# Patient Record
Sex: Male | Born: 1965 | Race: White | Hispanic: No | Marital: Married | Smoking: Current every day smoker
Health system: Southern US, Community
[De-identification: ages and names within clinical notes are randomized; demographics above are authoritative.]

## PROBLEM LIST (undated history)

## (undated) DIAGNOSIS — N200 Calculus of kidney: Secondary | ICD-10-CM

## (undated) DIAGNOSIS — E7800 Pure hypercholesterolemia, unspecified: Secondary | ICD-10-CM

## (undated) DIAGNOSIS — C4491 Basal cell carcinoma of skin, unspecified: Secondary | ICD-10-CM

## (undated) DIAGNOSIS — M4800 Spinal stenosis, site unspecified: Secondary | ICD-10-CM

## (undated) DIAGNOSIS — I10 Essential (primary) hypertension: Secondary | ICD-10-CM

## (undated) DIAGNOSIS — K219 Gastro-esophageal reflux disease without esophagitis: Secondary | ICD-10-CM

## (undated) DIAGNOSIS — Z87442 Personal history of urinary calculi: Secondary | ICD-10-CM

## (undated) HISTORY — PX: BACK SURGERY: SHX140

## (undated) HISTORY — PX: MOHS SURGERY: SUR867

---

## 2005-05-22 ENCOUNTER — Emergency Department (HOSPITAL_COMMUNITY): Admission: EM | Admit: 2005-05-22 | Discharge: 2005-05-22 | Payer: Self-pay | Admitting: Emergency Medicine

## 2009-10-12 ENCOUNTER — Ambulatory Visit (HOSPITAL_COMMUNITY): Admission: RE | Admit: 2009-10-12 | Discharge: 2009-10-12 | Payer: Self-pay | Admitting: Urology

## 2010-03-04 ENCOUNTER — Emergency Department (HOSPITAL_COMMUNITY): Admission: EM | Admit: 2010-03-04 | Discharge: 2010-03-05 | Payer: Self-pay | Admitting: Emergency Medicine

## 2010-12-18 ENCOUNTER — Emergency Department (HOSPITAL_COMMUNITY)
Admission: EM | Admit: 2010-12-18 | Discharge: 2010-12-18 | Payer: Self-pay | Source: Home / Self Care | Admitting: Emergency Medicine

## 2010-12-21 LAB — POCT I-STAT, CHEM 8
BUN: 16 mg/dL (ref 6–23)
Chloride: 109 mEq/L (ref 96–112)
Glucose, Bld: 107 mg/dL — ABNORMAL HIGH (ref 70–99)
Sodium: 140 mEq/L (ref 135–145)
TCO2: 23 mmol/L (ref 0–100)

## 2010-12-21 LAB — URINALYSIS, ROUTINE W REFLEX MICROSCOPIC
Bilirubin Urine: NEGATIVE
Ketones, ur: NEGATIVE mg/dL
Nitrite: NEGATIVE
Protein, ur: NEGATIVE mg/dL
Urobilinogen, UA: 0.2 mg/dL (ref 0.0–1.0)

## 2010-12-21 LAB — URINE MICROSCOPIC-ADD ON

## 2011-01-07 ENCOUNTER — Ambulatory Visit (HOSPITAL_BASED_OUTPATIENT_CLINIC_OR_DEPARTMENT_OTHER)
Admission: RE | Admit: 2011-01-07 | Discharge: 2011-01-07 | Disposition: A | Discharge status: Home / Self Care | Payer: 59 | Source: Ambulatory Visit | Attending: Urology | Admitting: Urology

## 2011-01-07 ENCOUNTER — Emergency Department (HOSPITAL_COMMUNITY)
Admission: EM | Admit: 2011-01-07 | Discharge: 2011-01-07 | Disposition: A | Discharge status: Home / Self Care | Payer: 59 | Source: Home / Self Care | Attending: Emergency Medicine | Admitting: Emergency Medicine

## 2011-01-07 DIAGNOSIS — N200 Calculus of kidney: Secondary | ICD-10-CM | POA: Insufficient documentation

## 2011-01-07 DIAGNOSIS — R109 Unspecified abdominal pain: Secondary | ICD-10-CM | POA: Insufficient documentation

## 2011-01-07 DIAGNOSIS — N201 Calculus of ureter: Secondary | ICD-10-CM | POA: Insufficient documentation

## 2011-01-07 LAB — POCT I-STAT, CHEM 8
Creatinine, Ser: 1.2 mg/dL (ref 0.4–1.5)
Glucose, Bld: 111 mg/dL — ABNORMAL HIGH (ref 70–99)
HCT: 49 % (ref 39.0–52.0)
Hemoglobin: 16.7 g/dL (ref 13.0–17.0)
Potassium: 3.9 mEq/L (ref 3.5–5.1)

## 2011-01-07 LAB — URINALYSIS, ROUTINE W REFLEX MICROSCOPIC
Ketones, ur: NEGATIVE mg/dL
Nitrite: NEGATIVE
Protein, ur: 30 mg/dL — AB
Specific Gravity, Urine: 1.025 (ref 1.005–1.030)
pH: 5.5 (ref 5.0–8.0)

## 2011-01-07 LAB — URINE MICROSCOPIC-ADD ON

## 2011-01-08 LAB — URINE CULTURE
Colony Count: NO GROWTH
Culture: NO GROWTH

## 2011-01-10 NOTE — Op Note (Signed)
  Tristan Wood, Tristan Wood              ACCOUNT NO.:  [id number]  MEDICAL RECORD NO.:  [id number]           PATIENT TYPE:  E  LOCATION:  [facility]                         FACILITY:  [facility]  PHYSICIAN:  Maretta Bees. Vonita Moss, M.D.DATE OF BIRTH:  December 07, 1965  DATE OF PROCEDURE:  01/07/2011 DATE OF DISCHARGE:  01/07/2011                              OPERATIVE REPORT   PREOPERATIVE DIAGNOSIS:  Distal right ureteral stone.  POSTOPERATIVE DIAGNOSIS:  Distal right ureteral stone.  PROCEDURE:  Cystoscopy, right ureteroscopy, laser fragmentation and basketing of stone.  SURGEON:  Maretta Bees. Vonita Moss, M.D.  ANESTHESIA:  General.  INDICATIONS:  This gentleman has had intermittent pain due to a 5 to 6 mm distal right ureteral stone.  He had severe pain in the last 24 hours and saw Dr. Aldean Ast in the office and scheduled him for me to proceed with removal of the stone.  PROCEDURE IN DETAIL:  The patient was brought to the operating room and placed in the lithotomy position.  The external genitalia were prepped and draped in the usual fashion.  He was cystoscoped.  The anterior urethra was normal.  The prostate had partial obstruction.  The bladder was unremarkable except for some bloody urine discharging from the right ureteral orifice.  A retractable core guidewire was placed easily up the ureter and I thought that I saw the stone migrate more proximal.  I then inserted the 6-French rigid ureteroscope and found the stone in the lower ureter.  It looked too large to basket but was very irregular and it appeared that it would easily fragment.  Therefore, I inserted the Holmium laser probe and broke the stone into multiple small pieces.  I then used a nitinol stone basket to pull these stones into the bladder and dropped them in the bladder.  I then ureteroscoped him one last time and there was no evidence of residual stones and there was no evidence of ureteral mucosal injury or undue trauma.  I  therefore left the double- J catheter out, which the patient had hoped would be the case.  I cystoscoped him and washed out a couple of tiny stone fragments.  The bladder was emptied and the scope was removed.  The patient was sent to the recovery room in good condition.     Maretta Bees. Vonita Moss, M.D.     LJP/MEDQ  D:  01/07/2011  T:  01/07/2011  Job:  [id number]  Electronically Signed by Larey Dresser M.D. on 01/10/2011 01:21:59 PM

## 2011-02-16 LAB — URINALYSIS, ROUTINE W REFLEX MICROSCOPIC
Ketones, ur: NEGATIVE mg/dL
Nitrite: NEGATIVE
Urobilinogen, UA: 1 mg/dL (ref 0.0–1.0)
pH: 5.5 (ref 5.0–8.0)

## 2011-02-16 LAB — URINE MICROSCOPIC-ADD ON

## 2012-01-01 ENCOUNTER — Emergency Department (HOSPITAL_COMMUNITY): Payer: 59

## 2012-01-01 ENCOUNTER — Encounter (HOSPITAL_COMMUNITY): Payer: Self-pay | Admitting: *Deleted

## 2012-01-01 ENCOUNTER — Emergency Department (HOSPITAL_COMMUNITY)
Admission: EM | Admit: 2012-01-01 | Discharge: 2012-01-01 | Disposition: A | Discharge status: Home / Self Care | Payer: 59 | Attending: Emergency Medicine | Admitting: Emergency Medicine

## 2012-01-01 DIAGNOSIS — F17200 Nicotine dependence, unspecified, uncomplicated: Secondary | ICD-10-CM | POA: Insufficient documentation

## 2012-01-01 DIAGNOSIS — R109 Unspecified abdominal pain: Secondary | ICD-10-CM | POA: Insufficient documentation

## 2012-01-01 DIAGNOSIS — R300 Dysuria: Secondary | ICD-10-CM | POA: Insufficient documentation

## 2012-01-01 DIAGNOSIS — R110 Nausea: Secondary | ICD-10-CM | POA: Insufficient documentation

## 2012-01-01 DIAGNOSIS — N200 Calculus of kidney: Secondary | ICD-10-CM | POA: Insufficient documentation

## 2012-01-01 HISTORY — DX: Calculus of kidney: N20.0

## 2012-01-01 HISTORY — DX: Basal cell carcinoma of skin, unspecified: C44.91

## 2012-01-01 LAB — URINALYSIS, ROUTINE W REFLEX MICROSCOPIC
Bilirubin Urine: NEGATIVE
Glucose, UA: NEGATIVE mg/dL
Leukocytes, UA: NEGATIVE
Nitrite: NEGATIVE
pH: 7 (ref 5.0–8.0)

## 2012-01-01 LAB — COMPREHENSIVE METABOLIC PANEL
Albumin: 3.9 g/dL (ref 3.5–5.2)
CO2: 23 mEq/L (ref 19–32)
Chloride: 105 mEq/L (ref 96–112)
GFR calc non Af Amer: >90 mL/min (ref >90)
Sodium: 138 mEq/L (ref 135–145)
Total Bilirubin: 0.3 mg/dL (ref 0.3–1.2)
Total Protein: 7 g/dL (ref 6.0–8.3)

## 2012-01-01 LAB — URINE MICROSCOPIC-ADD ON

## 2012-01-01 MED ORDER — TAMSULOSIN HCL 0.4 MG PO CAPS: 0.4000 mg | ORAL_CAPSULE | Freq: Every day | ORAL | Status: DC

## 2012-01-01 MED ORDER — HYDROMORPHONE HCL PF 1 MG/ML IJ SOLN
1.0000 mg | Freq: Once | INTRAMUSCULAR | Status: AC
  Administered 2012-01-01: 1 mg via INTRAVENOUS
  Filled 2012-01-01: qty 1

## 2012-01-01 MED ORDER — HYDROCODONE-ACETAMINOPHEN 5-325 MG PO TABS: 1.0000 | ORAL_TABLET | Freq: Three times a day (TID) | ORAL | Status: AC | PRN

## 2012-01-01 MED ORDER — KETOROLAC TROMETHAMINE 30 MG/ML IJ SOLN
30.0000 mg | Freq: Once | INTRAMUSCULAR | Status: AC
  Administered 2012-01-01: 30 mg via INTRAVENOUS
  Filled 2012-01-01: qty 1

## 2012-01-01 MED ORDER — SODIUM CHLORIDE 0.9 % IV BOLUS (SEPSIS)
1000.0000 mL | Freq: Once | INTRAVENOUS | Status: AC
  Administered 2012-01-01: 1000 mL via INTRAVENOUS

## 2012-01-01 MED ORDER — OXYCODONE-ACETAMINOPHEN 5-325 MG PO TABS: 1.0000 | ORAL_TABLET | Freq: Three times a day (TID) | ORAL | Status: AC | PRN

## 2012-01-01 NOTE — ED Provider Notes (Signed)
History     CSN: [id number]  Arrival date & time 01/01/12  1018   First MD Initiated Contact with Patient 01/01/12 1043      Chief Complaint  Patient presents with  . Flank Pain    Right  . Dysuria    HPI The patient p/w pain for two days.  The onset was gradual.  Since onset there has been crampy, sharp pain focally in the R flank.  The pain radiates minimally towards the R inguinal crease.  +mild dysuria and nausea.  No f/c, no emesis or diarrhea.  No relief w otc meds.  The patient has "8" prior kidney stones.  Some have required lithotripsy. Past Medical History  Diagnosis Date  . Kidney stones   . Cancer   . Basal cell carcinoma     Past Surgical History  Procedure Date  . Mohs surgery     History reviewed. No pertinent family history.  History  Substance Use Topics  . Smoking status: Current Everyday Smoker -- 2.0 packs/day    Types: Cigarettes  . Smokeless tobacco: Never Used  . Alcohol Use: Yes     occ      Review of Systems  Constitutional:       Per HPI, otherwise negative  HENT:       Per HPI, otherwise negative  Eyes: Negative.   Respiratory:       Per HPI, otherwise negative  Cardiovascular:       Per HPI, otherwise negative  Gastrointestinal: Negative for vomiting.  Genitourinary: Negative.   Musculoskeletal:       Per HPI, otherwise negative  Skin: Negative.   Neurological: Negative for syncope.    Allergies  Review of patient's allergies indicates no known allergies.  Home Medications   Current Outpatient Rx  Name Route Sig Dispense Refill  . ZOLPIDEM TARTRATE ER 12.5 MG PO TBCR Oral Take 12.5 mg by mouth every evening.      BP 124/93  Pulse 64  Temp(Src) 98 F (36.7 C) (Oral)  Resp 16  Wt 220 lb (99.791 kg)  SpO2 99%  Physical Exam  Nursing note and vitals reviewed. Constitutional: He is oriented to person, place, and time. He appears well-developed. No distress.  HENT:  Head: Normocephalic and atraumatic.  Eyes:  Conjunctivae and EOM are normal.  Cardiovascular: Normal rate and regular rhythm.   Pulmonary/Chest: Effort normal. No stridor. No respiratory distress.  Abdominal: He exhibits no distension. There is no tenderness. There is CVA tenderness. There is no rigidity and no guarding.  Musculoskeletal: He exhibits no edema.  Neurological: He is alert and oriented to person, place, and time.  Skin: Skin is warm and dry.  Psychiatric: He has a normal mood and affect.    ED Course  Procedures (including critical care time)  Labs Reviewed  URINALYSIS, ROUTINE W REFLEX MICROSCOPIC - Abnormal; Notable for the following:    APPearance TURBID (*)    Hgb urine dipstick LARGE (*)    All other components within normal limits  COMPREHENSIVE METABOLIC PANEL - Abnormal; Notable for the following:    Glucose, Bld 101 (*)    All other components within normal limits  URINE MICROSCOPIC-ADD ON   No results found.   No diagnosis found.  X-ray reviewed by me  MDM  This 46 year old male with multiple prior kidney stones presents with acute onset right flank pain.  100 he is in no distress.  Patient's urinalysis is consistent with kidney stone, and  his labs do not demonstrate worsening renal function.  The patient had an x-ray which did not demonstrate stones, though the patient was study are noted.  Given the patient's significant prior radiation exposure, he and I discussed the utility of additional imaging versus follow up with urology.  The patient defers CAT scan today, and will followup as directed.  He and his wife were provided explicit return precautions.  He was discharged in stable condition.        Gerhard Munch, MD 01/01/12 1254

## 2012-01-01 NOTE — ED Notes (Signed)
Pt reports right sides flank and back pain with radiation to his right groin. Pt reports he began to have pain on Friday that was less severe and intermittent. Pt reports pain has become worse this AM and is now constant. Pt denies taking anything for pain. Pt reports nausea without vomiting. Pt denies fever, painful urination, increased frequency, fevers, or diarrhea. Pt says he has a history of kidney stones and this pain feels similar.

## 2012-01-01 NOTE — ED Notes (Signed)
Pt from home c/o right flank pain, nausea and dysuria since Friday, hx of kidney stones.

## 2012-07-04 ENCOUNTER — Emergency Department (HOSPITAL_BASED_OUTPATIENT_CLINIC_OR_DEPARTMENT_OTHER): Payer: 59

## 2012-07-04 ENCOUNTER — Emergency Department (HOSPITAL_BASED_OUTPATIENT_CLINIC_OR_DEPARTMENT_OTHER)
Admission: EM | Admit: 2012-07-04 | Discharge: 2012-07-04 | Disposition: A | Discharge status: Home / Self Care | Payer: 59 | Attending: Emergency Medicine | Admitting: Emergency Medicine

## 2012-07-04 ENCOUNTER — Encounter (HOSPITAL_BASED_OUTPATIENT_CLINIC_OR_DEPARTMENT_OTHER): Payer: Self-pay | Admitting: *Deleted

## 2012-07-04 DIAGNOSIS — R109 Unspecified abdominal pain: Secondary | ICD-10-CM | POA: Insufficient documentation

## 2012-07-04 DIAGNOSIS — N201 Calculus of ureter: Secondary | ICD-10-CM | POA: Insufficient documentation

## 2012-07-04 DIAGNOSIS — N200 Calculus of kidney: Secondary | ICD-10-CM

## 2012-07-04 DIAGNOSIS — D72829 Elevated white blood cell count, unspecified: Secondary | ICD-10-CM

## 2012-07-04 LAB — BASIC METABOLIC PANEL
BUN: 16 mg/dL (ref 6–23)
Calcium: 9.5 mg/dL (ref 8.4–10.5)
Creatinine, Ser: 0.8 mg/dL (ref 0.50–1.35)
GFR calc Af Amer: >90 mL/min (ref >90)
GFR calc non Af Amer: >90 mL/min (ref >90)
Glucose, Bld: 112 mg/dL — ABNORMAL HIGH (ref 70–99)

## 2012-07-04 LAB — CBC WITH DIFFERENTIAL/PLATELET
Eosinophils Absolute: 0 10*3/uL (ref 0.0–0.7)
Hemoglobin: 16.5 g/dL (ref 13.0–17.0)
Lymphocytes Relative: 14 % (ref 12–46)
MCHC: 36.6 g/dL — ABNORMAL HIGH (ref 30.0–36.0)
Neutrophils Relative %: 77 % (ref 43–77)
Platelets: 383 10*3/uL (ref 150–400)
RBC: 5.31 MIL/uL (ref 4.22–5.81)
RDW: 13.4 % (ref 11.5–15.5)
WBC: 22.8 10*3/uL — ABNORMAL HIGH (ref 4.0–10.5)

## 2012-07-04 LAB — URINE MICROSCOPIC-ADD ON

## 2012-07-04 LAB — URINALYSIS, ROUTINE W REFLEX MICROSCOPIC
Leukocytes, UA: NEGATIVE
Nitrite: NEGATIVE
Specific Gravity, Urine: 1.023 (ref 1.005–1.030)
Urobilinogen, UA: 0.2 mg/dL (ref 0.0–1.0)

## 2012-07-04 MED ORDER — SULFAMETHOXAZOLE-TMP DS 800-160 MG PO TABS
1.0000 | ORAL_TABLET | Freq: Once | ORAL | Status: AC
  Administered 2012-07-04: 1 via ORAL
  Filled 2012-07-04: qty 1

## 2012-07-04 MED ORDER — FENTANYL CITRATE 0.05 MG/ML IJ SOLN
50.0000 ug | Freq: Once | INTRAMUSCULAR | Status: AC
  Administered 2012-07-04: 50 ug via INTRAVENOUS
  Filled 2012-07-04: qty 2

## 2012-07-04 MED ORDER — OXYCODONE-ACETAMINOPHEN 5-325 MG PO TABS
2.0000 | ORAL_TABLET | Freq: Once | ORAL | Status: DC
  Filled 2012-07-04: qty 2

## 2012-07-04 MED ORDER — TAMSULOSIN HCL 0.4 MG PO CAPS: 0.4000 mg | ORAL_CAPSULE | Freq: Every day | ORAL | Status: DC

## 2012-07-04 MED ORDER — OXYCODONE-ACETAMINOPHEN 5-325 MG PO TABS: 1.0000 | ORAL_TABLET | Freq: Four times a day (QID) | ORAL | Status: AC | PRN

## 2012-07-04 MED ORDER — ONDANSETRON 8 MG PO TBDP: ORAL_TABLET | ORAL | Status: AC

## 2012-07-04 MED ORDER — SULFAMETHOXAZOLE-TRIMETHOPRIM 800-160 MG PO TABS: 1.0000 | ORAL_TABLET | Freq: Two times a day (BID) | ORAL | Status: AC

## 2012-07-04 MED ORDER — IBUPROFEN 600 MG PO TABS: 600.0000 mg | ORAL_TABLET | Freq: Four times a day (QID) | ORAL | Status: AC | PRN

## 2012-07-04 MED ORDER — ONDANSETRON HCL 4 MG/2ML IJ SOLN
4.0000 mg | Freq: Once | INTRAMUSCULAR | Status: AC
  Administered 2012-07-04: 4 mg via INTRAVENOUS
  Filled 2012-07-04: qty 2

## 2012-07-04 MED ORDER — KETOROLAC TROMETHAMINE 30 MG/ML IJ SOLN
30.0000 mg | Freq: Once | INTRAMUSCULAR | Status: AC
  Administered 2012-07-04: 30 mg via INTRAVENOUS
  Filled 2012-07-04: qty 1

## 2012-07-04 MED ORDER — HYDROMORPHONE HCL PF 1 MG/ML IJ SOLN
1.0000 mg | Freq: Once | INTRAMUSCULAR | Status: AC
  Administered 2012-07-04: 1 mg via INTRAVENOUS
  Filled 2012-07-04: qty 1

## 2012-07-04 MED ORDER — SODIUM CHLORIDE 0.9 % IV BOLUS (SEPSIS)
1000.0000 mL | Freq: Once | INTRAVENOUS | Status: AC
  Administered 2012-07-04: 1000 mL via INTRAVENOUS

## 2012-07-04 NOTE — ED Notes (Signed)
Returned from CT scan.

## 2012-07-04 NOTE — ED Notes (Addendum)
C/o left flank and back pain that start about 4 hours ago. States hx of kidney stones and similar pain. States he feels like he needs to urinate,but not able to. C/o nausea denies fever. Rates pain 10/10  Also has a hx of back pain and took a vicodin around 930pm  Last night.

## 2012-07-04 NOTE — ED Provider Notes (Signed)
History     CSN: [id number]  Arrival date & time 07/04/12  0258   First MD Initiated Contact with Patient 07/04/12 0309      Chief Complaint  Patient presents with  . Flank Pain    (Consider location/radiation/quality/duration/timing/severity/associated sxs/prior treatment) Patient is a 45 y.o. male presenting with flank pain. The history is provided by the patient. No language interpreter was used.  Flank Pain This is a recurrent problem. The current episode started 6 to 12 hours ago. The problem occurs constantly. The problem has not changed since onset.Pertinent negatives include no chest pain, no abdominal pain, no headaches and no shortness of breath. Nothing aggravates the symptoms. Nothing relieves the symptoms. Treatments tried: narcotic pain medication. The treatment provided no relief.    Past Medical History  Diagnosis Date  . Kidney stones   . Cancer   . Basal cell carcinoma   . Kidney stone     Past Surgical History  Procedure Date  . Mohs surgery     No family history on file.  History  Substance Use Topics  . Smoking status: Current Everyday Smoker -- 2.0 packs/day    Types: Cigarettes  . Smokeless tobacco: Never Used  . Alcohol Use: Yes     occ      Review of Systems  Constitutional: Negative for fever.  Respiratory: Negative for shortness of breath.   Cardiovascular: Negative for chest pain.  Gastrointestinal: Negative for nausea, vomiting and abdominal pain.  Genitourinary: Positive for flank pain. Negative for dysuria and hematuria.  Neurological: Negative for headaches.  All other systems reviewed and are negative.    Allergies  Review of patient's allergies indicates no known allergies.  Home Medications   Current Outpatient Rx  Name Route Sig Dispense Refill  . TAMSULOSIN HCL 0.4 MG PO CAPS Oral Take 1 capsule (0.4 mg total) by mouth daily. 30 capsule 0  . ZOLPIDEM TARTRATE ER 12.5 MG PO TBCR Oral Take 12.5 mg by mouth every  evening.      BP 118/101  Pulse 86  Temp 97.5 F (36.4 C) (Oral)  Resp 20  Ht 6' (1.829 m)  Wt 230 lb (104.327 kg)  BMI 31.19 kg/m2  SpO2 100%  Physical Exam  Constitutional: He is oriented to person, place, and time. He appears well-developed and well-nourished. No distress.  HENT:  Head: Normocephalic and atraumatic.  Mouth/Throat: Oropharynx is clear and moist.  Eyes: Conjunctivae are normal. Pupils are equal, round, and reactive to light.  Neck: Normal range of motion. Neck supple.  Cardiovascular: Normal rate and regular rhythm.   Pulmonary/Chest: Effort normal and breath sounds normal. He has no wheezes. He has no rales.  Abdominal: Soft. Bowel sounds are normal. There is no tenderness. There is no rebound and no guarding.  Musculoskeletal: Normal range of motion.  Neurological: He is alert and oriented to person, place, and time.  Skin: Skin is warm and dry.  Psychiatric: He has a normal mood and affect.    ED Course  Procedures (including critical care time)   Labs Reviewed  CBC WITH DIFFERENTIAL  BASIC METABOLIC PANEL  URINALYSIS, ROUTINE W REFLEX MICROSCOPIC   No results found.   No diagnosis found.    MDM  Kidney stone with leukocytosis, no previous WBC in the computer system to compare with, have hydrated and will treat as infected stone.  Follow up with urology for ongoing care.  Return for fevers, chills, intractable vomiting or pain.  Stop vicodin and  switch to percocet. Patient and wife verbalize understanding and agree to follow up        Dawnita Molner Smitty Cords, MD 07/04/12 [phone]

## 2012-07-04 NOTE — ED Notes (Signed)
Patient transported to CT 

## 2012-07-16 ENCOUNTER — Other Ambulatory Visit: Payer: Self-pay | Admitting: Family Medicine

## 2012-07-16 DIAGNOSIS — IMO0002 Reserved for concepts with insufficient information to code with codable children: Secondary | ICD-10-CM

## 2012-07-17 ENCOUNTER — Other Ambulatory Visit: Payer: Self-pay | Admitting: Family Medicine

## 2012-07-17 DIAGNOSIS — IMO0002 Reserved for concepts with insufficient information to code with codable children: Secondary | ICD-10-CM

## 2012-07-24 ENCOUNTER — Ambulatory Visit
Admission: RE | Admit: 2012-07-24 | Discharge: 2012-07-24 | Disposition: A | Discharge status: Home / Self Care | Payer: 59 | Source: Ambulatory Visit | Attending: Family Medicine | Admitting: Family Medicine

## 2012-07-24 DIAGNOSIS — IMO0002 Reserved for concepts with insufficient information to code with codable children: Secondary | ICD-10-CM

## 2013-01-27 ENCOUNTER — Encounter (HOSPITAL_BASED_OUTPATIENT_CLINIC_OR_DEPARTMENT_OTHER): Payer: Self-pay

## 2013-01-27 ENCOUNTER — Emergency Department (HOSPITAL_BASED_OUTPATIENT_CLINIC_OR_DEPARTMENT_OTHER)
Admission: EM | Admit: 2013-01-27 | Discharge: 2013-01-27 | Disposition: A | Discharge status: Home / Self Care | Payer: 59 | Attending: Emergency Medicine | Admitting: Emergency Medicine

## 2013-01-27 ENCOUNTER — Emergency Department (HOSPITAL_BASED_OUTPATIENT_CLINIC_OR_DEPARTMENT_OTHER): Payer: 59

## 2013-01-27 DIAGNOSIS — R509 Fever, unspecified: Secondary | ICD-10-CM | POA: Insufficient documentation

## 2013-01-27 DIAGNOSIS — J40 Bronchitis, not specified as acute or chronic: Secondary | ICD-10-CM | POA: Insufficient documentation

## 2013-01-27 DIAGNOSIS — IMO0001 Reserved for inherently not codable concepts without codable children: Secondary | ICD-10-CM | POA: Insufficient documentation

## 2013-01-27 DIAGNOSIS — Z85828 Personal history of other malignant neoplasm of skin: Secondary | ICD-10-CM | POA: Insufficient documentation

## 2013-01-27 DIAGNOSIS — R059 Cough, unspecified: Secondary | ICD-10-CM | POA: Insufficient documentation

## 2013-01-27 DIAGNOSIS — Z792 Long term (current) use of antibiotics: Secondary | ICD-10-CM | POA: Insufficient documentation

## 2013-01-27 DIAGNOSIS — Z79899 Other long term (current) drug therapy: Secondary | ICD-10-CM | POA: Insufficient documentation

## 2013-01-27 DIAGNOSIS — Z87442 Personal history of urinary calculi: Secondary | ICD-10-CM | POA: Insufficient documentation

## 2013-01-27 DIAGNOSIS — J3489 Other specified disorders of nose and nasal sinuses: Secondary | ICD-10-CM | POA: Insufficient documentation

## 2013-01-27 DIAGNOSIS — J029 Acute pharyngitis, unspecified: Secondary | ICD-10-CM | POA: Insufficient documentation

## 2013-01-27 DIAGNOSIS — R093 Abnormal sputum: Secondary | ICD-10-CM | POA: Insufficient documentation

## 2013-01-27 DIAGNOSIS — F17200 Nicotine dependence, unspecified, uncomplicated: Secondary | ICD-10-CM | POA: Insufficient documentation

## 2013-01-27 DIAGNOSIS — R05 Cough: Secondary | ICD-10-CM | POA: Insufficient documentation

## 2013-01-27 DIAGNOSIS — R062 Wheezing: Secondary | ICD-10-CM | POA: Insufficient documentation

## 2013-01-27 LAB — COMPREHENSIVE METABOLIC PANEL
AST: 27 U/L (ref 0–37)
Albumin: 4.1 g/dL (ref 3.5–5.2)
BUN: 18 mg/dL (ref 6–23)
Calcium: 9.3 mg/dL (ref 8.4–10.5)
Chloride: 103 mEq/L (ref 96–112)
Creatinine, Ser: 0.8 mg/dL (ref 0.50–1.35)
Total Bilirubin: 0.4 mg/dL (ref 0.3–1.2)

## 2013-01-27 LAB — CK TOTAL AND CKMB (NOT AT ARMC)
CK, MB: 1.3 ng/mL (ref 0.3–4.0)
Relative Index: INVALID (ref 0.0–2.5)
Total CK: 84 U/L (ref 7–232)

## 2013-01-27 MED ORDER — ALBUTEROL SULFATE (5 MG/ML) 0.5% IN NEBU
5.0000 mg | INHALATION_SOLUTION | Freq: Once | RESPIRATORY_TRACT | Status: AC
  Administered 2013-01-27: 5 mg via RESPIRATORY_TRACT

## 2013-01-27 MED ORDER — ALBUTEROL SULFATE (5 MG/ML) 0.5% IN NEBU
INHALATION_SOLUTION | RESPIRATORY_TRACT | Status: AC
  Filled 2013-01-27: qty 1

## 2013-01-27 MED ORDER — IPRATROPIUM BROMIDE 0.02 % IN SOLN
RESPIRATORY_TRACT | Status: AC
  Filled 2013-01-27: qty 2.5

## 2013-01-27 MED ORDER — IPRATROPIUM BROMIDE 0.02 % IN SOLN
0.5000 mg | Freq: Once | RESPIRATORY_TRACT | Status: AC
  Administered 2013-01-27: 0.5 mg via RESPIRATORY_TRACT

## 2013-01-27 NOTE — ED Provider Notes (Signed)
History     CSN: [id number]  Arrival date & time 01/27/13  0610   First MD Initiated Contact with Patient 01/27/13 [phone]      Chief Complaint  Patient presents with  . Shortness of Breath    (Consider location/radiation/quality/duration/timing/severity/associated sxs/prior treatment) HPI Comments: Pt is a 47 y/o male with hx of basal cell carcinoma of the nose who had prior surgery of his nose for this reason.  He has hx of long time heavy smoking history and presents with 4 days of gradual onset of persistent sore throat, nasal congestion and cough.  This is gradually getting worse, not associated with fevers, it is associated with myalgias.  Sx were moderate to severe causing him to present to an UC and receive a Rx for doxy and MDI and tessalon - he states it has not helped.  He denies CP, abd pain, back pain, swelling or rashes but has ongoing SOB with cough.  Patient is a 47 y.o. male presenting with shortness of breath. The history is provided by the patient and the spouse.  Shortness of Breath   Past Medical History  Diagnosis Date  . Kidney stones   . Cancer   . Basal cell carcinoma   . Kidney stone     Past Surgical History  Procedure Laterality Date  . Mohs surgery      No family history on file.  History  Substance Use Topics  . Smoking status: Current Every Day Smoker -- 2.00 packs/day    Types: Cigarettes  . Smokeless tobacco: Never Used  . Alcohol Use: Yes     Comment: occ      Review of Systems  Respiratory: Positive for shortness of breath.   All other systems reviewed and are negative.    Allergies  Review of patient's allergies indicates no known allergies.  Home Medications   Current Outpatient Rx  Name  Route  Sig  Dispense  Refill  . albuterol (PROVENTIL HFA;VENTOLIN HFA) 108 (90 BASE) MCG/ACT inhaler   Inhalation   Inhale 2 puffs into the lungs every 6 (six) hours as needed for wheezing.         . benzonatate (TESSALON) 100 MG  capsule   Oral   Take 100 mg by mouth 3 (three) times daily as needed for cough.         . doxycycline (VIBRAMYCIN) 100 MG capsule   Oral   Take 100 mg by mouth 2 (two) times daily.           BP 130/90  Pulse 75  Temp(Src) 97.8 F (36.6 C) (Oral)  Resp 22  SpO2 96%  Physical Exam  Nursing note and vitals reviewed. Constitutional: He appears well-developed and well-nourished.  HENT:  Head: Normocephalic and atraumatic.  Mouth/Throat: Oropharynx is clear and moist. No oropharyngeal exudate.  TM's clear bilaterally, nasal passages with clear rhinorrhea bilaterally, associated turbinate swelling (pt states is a chronic problems due to multiple nasal fractures and his nasal surgery).  Eyes: Conjunctivae and EOM are normal. Pupils are equal, round, and reactive to light. Right eye exhibits no discharge. Left eye exhibits no discharge. No scleral icterus.  Neck: Normal range of motion. Neck supple. No JVD present. No thyromegaly present.  Cardiovascular: Normal rate, regular rhythm, normal heart sounds and intact distal pulses.  Exam reveals no gallop and no friction rub.   No murmur heard. Frequent PAC's  Pulmonary/Chest: Effort normal. No respiratory distress. He has wheezes ( mild end expiratory wheezing,  RR of 18 on my exam, speaks in full sentences). He has no rales.  Abdominal: Soft. Bowel sounds are normal. He exhibits no distension and no mass. There is no tenderness.  Musculoskeletal: Normal range of motion. He exhibits no edema and no tenderness.  Lymphadenopathy:    He has no cervical adenopathy.  Neurological: He is alert. Coordination normal.  Skin: Skin is warm and dry. No rash noted. No erythema.  Psychiatric: He has a normal mood and affect. His behavior is normal.    ED Course  Procedures (including critical care time)  Labs Reviewed  COMPREHENSIVE METABOLIC PANEL - Abnormal; Notable for the following:    Glucose, Bld 103 (*)    All other components within  normal limits  CK TOTAL AND CKMB   Dg Chest 2 View  01/27/2013  *RADIOLOGY REPORT*  Clinical Data: Shortness of breath, cough.  CHEST - 2 VIEW  Comparison: 07/04/2012  Findings: Mild hyperinflation of the lungs.  Heart is normal size. Peribronchial thickening.  No confluent opacities or effusions.  No acute bony abnormality.  IMPRESSION: Hyperinflation, bronchitic changes.   Original Report Authenticated By: Charlett Nose, M.D.      1. Bronchitis       MDM  The pt had significant wheezing on arrival - at this time he is feeling better after receiving nebulized albuterol.    I have personally interpreted the 2 view chest with PA and lateral views showing no signs of acute infiltrate, pulmonary edema, abnormal mediastinum or pneumothorax.  There is some evidence of hyperexpansion c/w the pt's smoking history.  I agree with ongoing tx with albuterol at home as well as doxycycline at home.  I have given the pt a spacer to use with his inhaler and instructed him on it's use.  He appears stable, is not febrile and is feeling better.  Instructions for return given and understanding expressed.        Vida Roller, MD 01/28/13 [phone]

## 2013-01-27 NOTE — ED Notes (Signed)
Patient reports that he developed sore throat, fever, and cough this past Wednesday. [facility] and given [facility]er and antibiotic. Reports productive cough with white-yellwo sputum, also taking doxycycline for same. Smoker.

## 2013-08-25 ENCOUNTER — Emergency Department (HOSPITAL_BASED_OUTPATIENT_CLINIC_OR_DEPARTMENT_OTHER)
Admission: EM | Admit: 2013-08-25 | Discharge: 2013-08-25 | Disposition: A | Discharge status: Home / Self Care | Payer: 59 | Attending: Emergency Medicine | Admitting: Emergency Medicine

## 2013-08-25 ENCOUNTER — Encounter (HOSPITAL_BASED_OUTPATIENT_CLINIC_OR_DEPARTMENT_OTHER): Payer: Self-pay | Admitting: Emergency Medicine

## 2013-08-25 ENCOUNTER — Emergency Department (HOSPITAL_BASED_OUTPATIENT_CLINIC_OR_DEPARTMENT_OTHER): Payer: 59

## 2013-08-25 DIAGNOSIS — F17200 Nicotine dependence, unspecified, uncomplicated: Secondary | ICD-10-CM | POA: Insufficient documentation

## 2013-08-25 DIAGNOSIS — Z85828 Personal history of other malignant neoplasm of skin: Secondary | ICD-10-CM | POA: Insufficient documentation

## 2013-08-25 DIAGNOSIS — N200 Calculus of kidney: Secondary | ICD-10-CM

## 2013-08-25 DIAGNOSIS — Z79899 Other long term (current) drug therapy: Secondary | ICD-10-CM | POA: Insufficient documentation

## 2013-08-25 DIAGNOSIS — R319 Hematuria, unspecified: Secondary | ICD-10-CM | POA: Insufficient documentation

## 2013-08-25 LAB — URINE MICROSCOPIC-ADD ON

## 2013-08-25 LAB — URINALYSIS, ROUTINE W REFLEX MICROSCOPIC
Bilirubin Urine: NEGATIVE
Nitrite: NEGATIVE
Protein, ur: NEGATIVE mg/dL
Specific Gravity, Urine: 1.02 (ref 1.005–1.030)
Urobilinogen, UA: 1 mg/dL (ref 0.0–1.0)

## 2013-08-25 MED ORDER — HYDROMORPHONE HCL PF 1 MG/ML IJ SOLN
1.0000 mg | Freq: Once | INTRAMUSCULAR | Status: AC
  Administered 2013-08-25: 1 mg via INTRAVENOUS
  Filled 2013-08-25: qty 1

## 2013-08-25 MED ORDER — ONDANSETRON HCL 4 MG/2ML IJ SOLN
4.0000 mg | Freq: Once | INTRAMUSCULAR | Status: AC
  Administered 2013-08-25: 4 mg via INTRAVENOUS
  Filled 2013-08-25: qty 2

## 2013-08-25 MED ORDER — SODIUM CHLORIDE 0.9 % IV BOLUS (SEPSIS)
1000.0000 mL | Freq: Once | INTRAVENOUS | Status: AC
  Administered 2013-08-25: 1000 mL via INTRAVENOUS

## 2013-08-25 MED ORDER — OXYCODONE-ACETAMINOPHEN 5-325 MG PO TABS: 2.0000 | ORAL_TABLET | ORAL | Status: DC | PRN

## 2013-08-25 MED ORDER — KETOROLAC TROMETHAMINE 30 MG/ML IJ SOLN
30.0000 mg | Freq: Once | INTRAMUSCULAR | Status: AC
  Administered 2013-08-25: 30 mg via INTRAVENOUS
  Filled 2013-08-25: qty 1

## 2013-08-25 MED ORDER — TAMSULOSIN HCL 0.4 MG PO CAPS: 0.4000 mg | ORAL_CAPSULE | Freq: Every day | ORAL | Status: DC

## 2013-08-25 NOTE — ED Notes (Signed)
Pt having right sided flank pain since 2 am.  Some nausea.  Pt has history of kidney stones and states they usually give him dilaudid instead of morphine.

## 2013-08-25 NOTE — ED Provider Notes (Signed)
CSN: [id number]     Arrival date & time 08/25/13  0757 History   First MD Initiated Contact with Patient 08/25/13 0813     Chief Complaint  Patient presents with  . Flank Pain   (Consider location/radiation/quality/duration/timing/severity/associated sxs/prior Treatment) HPI Comments: Patient is otherwise healthy male presents with complaints of severe right-sided flank pain that started at 2 AM. States he was asleep at the time and this woke him up. He has a history of renal calculi and this pain is similar to this. He tells me he has had stones removed by Dr. Mena Goes at [facility] urology in the past. It has been approximately one year since his last incident with a kidney stone. He denies fevers or chills. Denies any bloody urine.  Patient is a 47 y.o. male presenting with flank pain. The history is provided by the patient.  Flank Pain This is a recurrent problem. Episode onset: 2 AM. The problem occurs constantly. The problem has been rapidly worsening. Associated symptoms comments: Flank pain. Nothing aggravates the symptoms. Nothing relieves the symptoms. He has tried nothing for the symptoms. The treatment provided no relief.    Past Medical History  Diagnosis Date  . Kidney stones   . Cancer   . Basal cell carcinoma   . Kidney stone    Past Surgical History  Procedure Laterality Date  . Mohs surgery     History reviewed. No pertinent family history. History  Substance Use Topics  . Smoking status: Current Every Day Smoker -- 2.00 packs/day    Types: Cigarettes  . Smokeless tobacco: Never Used  . Alcohol Use: Yes     Comment: occ    Review of Systems  Genitourinary: Positive for flank pain.  All other systems reviewed and are negative.    Allergies  Review of patient's allergies indicates no known allergies.  Home Medications   Current Outpatient Rx  Name  Route  Sig  Dispense  Refill  . omeprazole (PRILOSEC) 20 MG capsule   Oral   Take 20 mg by mouth daily.         Marland Kitchen zolpidem (AMBIEN) 10 MG tablet   Oral   Take 10 mg by mouth at bedtime as needed for sleep.          BP 108/96  Pulse 58  Temp(Src) 97.7 F (36.5 C) (Oral)  Resp 16  Wt 230 lb (104.327 kg)  BMI 31.19 kg/m2  SpO2 100% Physical Exam  Nursing note and vitals reviewed. Constitutional: He is oriented to person, place, and time. He appears well-developed and well-nourished.  Patient is a 47 year old male who appears uncomfortable.  HENT:  Head: Normocephalic and atraumatic.  Mouth/Throat: Oropharynx is clear and moist.  Neck: Normal range of motion. Neck supple.  Cardiovascular: Normal rate, regular rhythm and normal heart sounds.   No murmur heard. Pulmonary/Chest: Effort normal and breath sounds normal. No respiratory distress. He has no wheezes.  Abdominal: Soft. Bowel sounds are normal. He exhibits no distension. There is no tenderness.  There is mild right-sided CVA tenderness. There is mild right lower quadrant tenderness to palpation without rebound or guarding.  Musculoskeletal: Normal range of motion. He exhibits no edema.  Neurological: He is alert and oriented to person, place, and time.  Skin: Skin is warm and dry. He is not diaphoretic.    ED Course  Procedures (including critical care time) Labs Review Labs Reviewed  URINALYSIS, DIPSTICK ONLY  URINALYSIS, ROUTINE W REFLEX MICROSCOPIC   Imaging Review  No results found.  MDM  No diagnosis found. Patient presents with right flank pain and history of kidney stones. Workup reveals hematuria and a 3 mm proximal ureteral stone. He was given repeat doses of pain medication and is now feeling better. He has a urologist, Dr. Mena Goes with [facility] who he will call tomorrow to arrange followup. Seems appropriate for discharge, return when necessary for fever, worsening pain, or other new or bothersome symptoms.    Geoffery Lyons, MD 08/25/13 1039

## 2014-08-15 ENCOUNTER — Emergency Department (HOSPITAL_BASED_OUTPATIENT_CLINIC_OR_DEPARTMENT_OTHER)
Admission: EM | Admit: 2014-08-15 | Discharge: 2014-08-15 | Disposition: A | Discharge status: Home / Self Care | Payer: 59 | Attending: Emergency Medicine | Admitting: Emergency Medicine

## 2014-08-15 ENCOUNTER — Encounter (HOSPITAL_BASED_OUTPATIENT_CLINIC_OR_DEPARTMENT_OTHER): Payer: Self-pay | Admitting: Emergency Medicine

## 2014-08-15 ENCOUNTER — Emergency Department (HOSPITAL_BASED_OUTPATIENT_CLINIC_OR_DEPARTMENT_OTHER): Payer: 59

## 2014-08-15 DIAGNOSIS — R109 Unspecified abdominal pain: Secondary | ICD-10-CM | POA: Diagnosis present

## 2014-08-15 DIAGNOSIS — Z85828 Personal history of other malignant neoplasm of skin: Secondary | ICD-10-CM | POA: Insufficient documentation

## 2014-08-15 DIAGNOSIS — N200 Calculus of kidney: Secondary | ICD-10-CM

## 2014-08-15 DIAGNOSIS — Z79899 Other long term (current) drug therapy: Secondary | ICD-10-CM | POA: Insufficient documentation

## 2014-08-15 DIAGNOSIS — Z8739 Personal history of other diseases of the musculoskeletal system and connective tissue: Secondary | ICD-10-CM | POA: Insufficient documentation

## 2014-08-15 DIAGNOSIS — F17200 Nicotine dependence, unspecified, uncomplicated: Secondary | ICD-10-CM | POA: Diagnosis not present

## 2014-08-15 HISTORY — DX: Spinal stenosis, site unspecified: M48.00

## 2014-08-15 LAB — CBC WITH DIFFERENTIAL/PLATELET
BASOS ABS: 0.1 10*3/uL (ref 0.0–0.1)
BASOS PCT: 1 % (ref 0–1)
Eosinophils Absolute: 0.3 10*3/uL (ref 0.0–0.7)
Eosinophils Relative: 3 % (ref 0–5)
HEMATOCRIT: 45.3 % (ref 39.0–52.0)
HEMOGLOBIN: 16.2 g/dL (ref 13.0–17.0)
LYMPHS PCT: 23 % (ref 12–46)
Lymphs Abs: 2.5 10*3/uL (ref 0.7–4.0)
MCH: 31.2 pg (ref 26.0–34.0)
MCHC: 35.8 g/dL (ref 30.0–36.0)
MCV: 87.3 fL (ref 78.0–100.0)
MONO ABS: 1.4 10*3/uL — AB (ref 0.1–1.0)
MONOS PCT: 14 % — AB (ref 3–12)
Neutro Abs: 6.3 10*3/uL (ref 1.7–7.7)
Neutrophils Relative %: 59 % (ref 43–77)
Platelets: 386 10*3/uL (ref 150–400)
RBC: 5.19 MIL/uL (ref 4.22–5.81)
RDW: 13.6 % (ref 11.5–15.5)
WBC: 10.5 10*3/uL (ref 4.0–10.5)

## 2014-08-15 LAB — BASIC METABOLIC PANEL
ANION GAP: 15 (ref 5–15)
BUN: 15 mg/dL (ref 6–23)
CO2: 22 mEq/L (ref 19–32)
CREATININE: 1 mg/dL (ref 0.50–1.35)
Calcium: 9.6 mg/dL (ref 8.4–10.5)
Chloride: 105 mEq/L (ref 96–112)
GFR calc non Af Amer: 86 mL/min — ABNORMAL LOW (ref >90)
Glucose, Bld: 123 mg/dL — ABNORMAL HIGH (ref 70–99)
POTASSIUM: 4.4 meq/L (ref 3.7–5.3)
Sodium: 142 mEq/L (ref 137–147)

## 2014-08-15 LAB — URINE MICROSCOPIC-ADD ON

## 2014-08-15 LAB — URINALYSIS, ROUTINE W REFLEX MICROSCOPIC
Bilirubin Urine: NEGATIVE
Glucose, UA: NEGATIVE mg/dL
Ketones, ur: 15 mg/dL — AB
LEUKOCYTES UA: NEGATIVE
NITRITE: NEGATIVE
PH: 5.5 (ref 5.0–8.0)
Protein, ur: 30 mg/dL — AB
Specific Gravity, Urine: 1.026 (ref 1.005–1.030)
UROBILINOGEN UA: 1 mg/dL (ref 0.0–1.0)

## 2014-08-15 MED ORDER — ONDANSETRON 8 MG PO TBDP: ORAL_TABLET | ORAL | Status: DC

## 2014-08-15 MED ORDER — TAMSULOSIN HCL 0.4 MG PO CAPS: 0.4000 mg | ORAL_CAPSULE | Freq: Every day | ORAL | Status: DC

## 2014-08-15 MED ORDER — TAMSULOSIN HCL 0.4 MG PO CAPS
0.4000 mg | ORAL_CAPSULE | Freq: Every day | ORAL | Status: DC
  Administered 2014-08-15: 0.4 mg via ORAL
  Filled 2014-08-15: qty 1

## 2014-08-15 MED ORDER — KETOROLAC TROMETHAMINE 30 MG/ML IJ SOLN
30.0000 mg | Freq: Once | INTRAMUSCULAR | Status: AC
  Administered 2014-08-15: 30 mg via INTRAVENOUS
  Filled 2014-08-15: qty 1

## 2014-08-15 MED ORDER — OXYCODONE-ACETAMINOPHEN 5-325 MG PO TABS: 1.0000 | ORAL_TABLET | Freq: Four times a day (QID) | ORAL | Status: DC | PRN

## 2014-08-15 MED ORDER — SODIUM CHLORIDE 0.9 % IV BOLUS (SEPSIS)
1000.0000 mL | Freq: Once | INTRAVENOUS | Status: AC
  Administered 2014-08-15: 1000 mL via INTRAVENOUS

## 2014-08-15 MED ORDER — ONDANSETRON HCL 4 MG/2ML IJ SOLN
4.0000 mg | Freq: Once | INTRAMUSCULAR | Status: AC
  Administered 2014-08-15: 4 mg via INTRAVENOUS
  Filled 2014-08-15: qty 2

## 2014-08-15 MED ORDER — NAPROXEN 375 MG PO TABS: 375.0000 mg | ORAL_TABLET | Freq: Two times a day (BID) | ORAL | Status: DC

## 2014-08-15 MED ORDER — OXYCODONE-ACETAMINOPHEN 5-325 MG PO TABS
2.0000 | ORAL_TABLET | Freq: Once | ORAL | Status: AC
  Administered 2014-08-15: 2 via ORAL
  Filled 2014-08-15: qty 2

## 2014-08-15 NOTE — ED Notes (Signed)
Pt reports onset of right flank pain that awoke him from sleep at 11pm. Hx of kidney stones.

## 2014-08-15 NOTE — ED Notes (Signed)
  Pt transported to ct 

## 2014-08-15 NOTE — Discharge Instructions (Signed)

## 2014-08-15 NOTE — ED Provider Notes (Signed)
CSN: [id number]     Arrival date & time 08/15/14  0052 History   First MD Initiated Contact with Patient 08/15/14 0125     Chief Complaint  Patient presents with  . Flank Pain     (Consider location/radiation/quality/duration/timing/severity/associated sxs/prior Treatment) Patient is a 48 y.o. male presenting with flank pain. The history is provided by the patient.  Flank Pain This is a new problem. The current episode started 1 to 2 hours ago. The problem occurs constantly. The problem has not changed since onset.Pertinent negatives include no chest pain, no abdominal pain, no headaches and no shortness of breath. Nothing aggravates the symptoms. Nothing relieves the symptoms. He has tried nothing for the symptoms. The treatment provided no relief.    Past Medical History  Diagnosis Date  . Kidney stones   . Cancer   . Basal cell carcinoma   . Kidney stone   . Spinal stenosis    Past Surgical History  Procedure Laterality Date  . Mohs surgery     No family history on file. History  Substance Use Topics  . Smoking status: Current Every Day Smoker -- 2.00 packs/day    Types: Cigarettes  . Smokeless tobacco: Never Used  . Alcohol Use: No     Comment: occ    Review of Systems  Respiratory: Negative for shortness of breath.   Cardiovascular: Negative for chest pain.  Gastrointestinal: Negative for vomiting and abdominal pain.  Genitourinary: Positive for flank pain. Negative for dysuria.  Neurological: Negative for headaches.  All other systems reviewed and are negative.     Allergies  Review of patient's allergies indicates no known allergies.  Home Medications   Prior to Admission medications   Medication Sig Start Date End Date Taking? Authorizing Provider  omeprazole (PRILOSEC) 20 MG capsule Take 20 mg by mouth daily.   Yes Historical Provider, MD  zolpidem (AMBIEN) 10 MG tablet Take 10 mg by mouth at bedtime as needed for sleep.   Yes Historical Provider, MD   oxyCODONE-acetaminophen (PERCOCET) 5-325 MG per tablet Take 2 tablets by mouth every 4 (four) hours as needed for pain. 08/25/13   Veryl Speak, MD  tamsulosin (FLOMAX) 0.4 MG CAPS capsule Take 1 capsule (0.4 mg total) by mouth daily. 08/25/13   Veryl Speak, MD   BP 143/95  Pulse 58  Temp(Src) 97.4 F (36.3 C) (Oral)  Resp 20  Ht 6' (1.829 m)  Wt 230 lb (104.327 kg)  BMI 31.19 kg/m2  SpO2 99% Physical Exam  Constitutional: He is oriented to person, place, and time. He appears well-developed and well-nourished. No distress.  HENT:  Head: Normocephalic and atraumatic.  Eyes: Conjunctivae and EOM are normal.  Neck: Normal range of motion. Neck supple.  Cardiovascular: Normal rate, regular rhythm and intact distal pulses.   Pulmonary/Chest: Effort normal and breath sounds normal. He has no wheezes. He has no rales.  Abdominal: Soft. Bowel sounds are normal. There is no tenderness. There is no rebound and no guarding.  Musculoskeletal: Normal range of motion.  Neurological: He is alert and oriented to person, place, and time.  Skin: Skin is warm and dry.  Psychiatric: He has a normal mood and affect.    ED Course  Procedures (including critical care time) Labs Review Labs Reviewed  CBC WITH DIFFERENTIAL - Abnormal; Notable for the following:    Monocytes Relative 14 (*)    Monocytes Absolute 1.4 (*)    All other components within normal limits  BASIC METABOLIC  PANEL  URINALYSIS, ROUTINE W REFLEX MICROSCOPIC    Imaging Review No results found.   EKG Interpretation None      MDM   Final diagnoses:  None    Kidney stone, pain medication anti emetics NSAIDs and flomax and call Dr. Junious Silk to be seen in follow up strain all urine.      Carlisle Beers, MD 08/15/14 [phone]

## 2014-08-29 ENCOUNTER — Emergency Department (HOSPITAL_BASED_OUTPATIENT_CLINIC_OR_DEPARTMENT_OTHER): Payer: 59 | Admitting: Anesthesiology

## 2014-08-29 ENCOUNTER — Encounter (HOSPITAL_BASED_OUTPATIENT_CLINIC_OR_DEPARTMENT_OTHER): Payer: Self-pay | Admitting: Emergency Medicine

## 2014-08-29 ENCOUNTER — Encounter (HOSPITAL_BASED_OUTPATIENT_CLINIC_OR_DEPARTMENT_OTHER): Payer: 59 | Admitting: Anesthesiology

## 2014-08-29 ENCOUNTER — Emergency Department (HOSPITAL_BASED_OUTPATIENT_CLINIC_OR_DEPARTMENT_OTHER): Payer: 59

## 2014-08-29 ENCOUNTER — Ambulatory Visit (HOSPITAL_BASED_OUTPATIENT_CLINIC_OR_DEPARTMENT_OTHER): Admission: RE | Admit: 2014-08-29 | Payer: 59 | Source: Ambulatory Visit | Admitting: Urology

## 2014-08-29 ENCOUNTER — Encounter (HOSPITAL_COMMUNITY)
Admission: EM | Disposition: A | Discharge status: Home / Self Care | Payer: Self-pay | Source: Home / Self Care | Attending: Emergency Medicine

## 2014-08-29 ENCOUNTER — Emergency Department (HOSPITAL_BASED_OUTPATIENT_CLINIC_OR_DEPARTMENT_OTHER)
Admission: EM | Admit: 2014-08-29 | Discharge: 2014-08-29 | Disposition: A | Discharge status: Home / Self Care | Payer: 59 | Attending: Urology | Admitting: Urology

## 2014-08-29 ENCOUNTER — Other Ambulatory Visit: Payer: Self-pay | Admitting: Urology

## 2014-08-29 DIAGNOSIS — F17210 Nicotine dependence, cigarettes, uncomplicated: Secondary | ICD-10-CM | POA: Diagnosis not present

## 2014-08-29 DIAGNOSIS — N201 Calculus of ureter: Secondary | ICD-10-CM | POA: Diagnosis present

## 2014-08-29 DIAGNOSIS — Z791 Long term (current) use of non-steroidal anti-inflammatories (NSAID): Secondary | ICD-10-CM | POA: Diagnosis not present

## 2014-08-29 DIAGNOSIS — Z87442 Personal history of urinary calculi: Secondary | ICD-10-CM | POA: Diagnosis not present

## 2014-08-29 DIAGNOSIS — Z79899 Other long term (current) drug therapy: Secondary | ICD-10-CM | POA: Diagnosis not present

## 2014-08-29 DIAGNOSIS — N200 Calculus of kidney: Secondary | ICD-10-CM

## 2014-08-29 HISTORY — PX: CYSTOSCOPY WITH RETROGRADE PYELOGRAM, URETEROSCOPY AND STENT PLACEMENT: SHX5789

## 2014-08-29 LAB — CBC WITH DIFFERENTIAL/PLATELET
BASOS ABS: 0.1 10*3/uL (ref 0.0–0.1)
Basophils Relative: 1 % (ref 0–1)
EOS ABS: 0.3 10*3/uL (ref 0.0–0.7)
EOS PCT: 3 % (ref 0–5)
HEMATOCRIT: 43 % (ref 39.0–52.0)
Hemoglobin: 15.3 g/dL (ref 13.0–17.0)
Lymphocytes Relative: 29 % (ref 12–46)
Lymphs Abs: 3 10*3/uL (ref 0.7–4.0)
MCH: 31 pg (ref 26.0–34.0)
MCHC: 35.6 g/dL (ref 30.0–36.0)
MCV: 87 fL (ref 78.0–100.0)
MONO ABS: 1.2 10*3/uL — AB (ref 0.1–1.0)
Monocytes Relative: 11 % (ref 3–12)
Neutro Abs: 5.9 10*3/uL (ref 1.7–7.7)
Neutrophils Relative %: 56 % (ref 43–77)
PLATELETS: 391 10*3/uL (ref 150–400)
RBC: 4.94 MIL/uL (ref 4.22–5.81)
RDW: 13.2 % (ref 11.5–15.5)
WBC: 10.4 10*3/uL (ref 4.0–10.5)

## 2014-08-29 LAB — URINALYSIS, ROUTINE W REFLEX MICROSCOPIC
BILIRUBIN URINE: NEGATIVE
Glucose, UA: NEGATIVE mg/dL
KETONES UR: 15 mg/dL — AB
NITRITE: NEGATIVE
Protein, ur: 100 mg/dL — AB
Specific Gravity, Urine: 1.023 (ref 1.005–1.030)
Urobilinogen, UA: 0.2 mg/dL (ref 0.0–1.0)
pH: 5.5 (ref 5.0–8.0)

## 2014-08-29 LAB — URINE MICROSCOPIC-ADD ON

## 2014-08-29 LAB — BASIC METABOLIC PANEL
ANION GAP: 16 — AB (ref 5–15)
BUN: 20 mg/dL (ref 6–23)
CALCIUM: 9.3 mg/dL (ref 8.4–10.5)
CO2: 19 mEq/L (ref 19–32)
Chloride: 104 mEq/L (ref 96–112)
Creatinine, Ser: 1.1 mg/dL (ref 0.50–1.35)
GFR calc Af Amer: 89 mL/min — ABNORMAL LOW (ref >90)
GFR, EST NON AFRICAN AMERICAN: 77 mL/min — AB (ref >90)
Glucose, Bld: 101 mg/dL — ABNORMAL HIGH (ref 70–99)
Potassium: 4.8 mEq/L (ref 3.7–5.3)
Sodium: 139 mEq/L (ref 137–147)

## 2014-08-29 SURGERY — CYSTOURETEROSCOPY, WITH RETROGRADE PYELOGRAM AND STENT INSERTION
Anesthesia: General | Site: Ureter | Laterality: Right

## 2014-08-29 MED ORDER — IOHEXOL 350 MG/ML SOLN
INTRAVENOUS | Status: DC | PRN
  Administered 2014-08-29: 10 mL

## 2014-08-29 MED ORDER — ONDANSETRON HCL 4 MG/2ML IJ SOLN
4.0000 mg | Freq: Once | INTRAMUSCULAR | Status: AC
  Administered 2014-08-29: 4 mg via INTRAVENOUS

## 2014-08-29 MED ORDER — ONDANSETRON HCL 4 MG/2ML IJ SOLN
INTRAMUSCULAR | Status: AC
  Filled 2014-08-29: qty 2

## 2014-08-29 MED ORDER — SODIUM CHLORIDE 0.9 % IR SOLN
Status: DC | PRN
  Administered 2014-08-29: 6000 mL

## 2014-08-29 MED ORDER — MIDAZOLAM HCL 5 MG/5ML IJ SOLN
INTRAMUSCULAR | Status: DC | PRN
Start: 2014-08-29 — End: 2014-08-29
  Administered 2014-08-29: 2 mg via INTRAVENOUS

## 2014-08-29 MED ORDER — OXYCODONE HCL 5 MG PO TABS: 5.0000 mg | ORAL_TABLET | Freq: Once | ORAL | Status: DC | PRN

## 2014-08-29 MED ORDER — KETOROLAC TROMETHAMINE 30 MG/ML IJ SOLN
INTRAMUSCULAR | Status: AC
  Filled 2014-08-29: qty 1

## 2014-08-29 MED ORDER — CEFAZOLIN SODIUM-DEXTROSE 2-3 GM-% IV SOLR
2.0000 g | INTRAVENOUS | Status: AC
  Administered 2014-08-29: 2 g via INTRAVENOUS

## 2014-08-29 MED ORDER — KETOROLAC TROMETHAMINE 30 MG/ML IJ SOLN
INTRAMUSCULAR | Status: DC | PRN
  Administered 2014-08-29: 30 mg via INTRAVENOUS

## 2014-08-29 MED ORDER — HYDROMORPHONE HCL 1 MG/ML IJ SOLN
1.0000 mg | Freq: Once | INTRAMUSCULAR | Status: AC
  Administered 2014-08-29: 1 mg via INTRAVENOUS
  Filled 2014-08-29: qty 1

## 2014-08-29 MED ORDER — DIPHENHYDRAMINE HCL 50 MG/ML IJ SOLN
INTRAMUSCULAR | Status: DC | PRN
  Administered 2014-08-29: 12.5 mg via INTRAVENOUS

## 2014-08-29 MED ORDER — CEFAZOLIN SODIUM 1-5 GM-% IV SOLN: 1.0000 g | INTRAVENOUS | Status: DC

## 2014-08-29 MED ORDER — HYDROMORPHONE HCL 2 MG/ML IJ SOLN
2.0000 mg | Freq: Once | INTRAMUSCULAR | Status: AC
Start: 2014-08-29 — End: 2014-08-29
  Administered 2014-08-29: 2 mg via INTRAVENOUS
  Filled 2014-08-29: qty 1

## 2014-08-29 MED ORDER — HYDROMORPHONE HCL 1 MG/ML IJ SOLN: 0.2500 mg | INTRAMUSCULAR | Status: DC | PRN

## 2014-08-29 MED ORDER — LIDOCAINE HCL (CARDIAC) 20 MG/ML IV SOLN
INTRAVENOUS | Status: DC | PRN
  Administered 2014-08-29: 50 mg via INTRAVENOUS

## 2014-08-29 MED ORDER — ACETAMINOPHEN 10 MG/ML IV SOLN
INTRAVENOUS | Status: DC | PRN
  Administered 2014-08-29: 1000 mg via INTRAVENOUS

## 2014-08-29 MED ORDER — ONDANSETRON HCL 4 MG/2ML IJ SOLN
INTRAMUSCULAR | Status: DC | PRN
  Administered 2014-08-29: 4 mg via INTRAVENOUS

## 2014-08-29 MED ORDER — DEXAMETHASONE SODIUM PHOSPHATE 4 MG/ML IJ SOLN
INTRAMUSCULAR | Status: DC | PRN
  Administered 2014-08-29: 10 mg via INTRAVENOUS

## 2014-08-29 MED ORDER — MEPERIDINE HCL 50 MG/ML IJ SOLN
6.2500 mg | INTRAMUSCULAR | Status: DC | PRN
Start: 2014-08-29 — End: 2014-08-29

## 2014-08-29 MED ORDER — STERILE WATER FOR IRRIGATION IR SOLN
Status: DC | PRN
  Administered 2014-08-29: 500 mL

## 2014-08-29 MED ORDER — HYDROCODONE-ACETAMINOPHEN 5-325 MG PO TABS: 1.0000 | ORAL_TABLET | Freq: Four times a day (QID) | ORAL | Status: DC | PRN

## 2014-08-29 MED ORDER — PROPOFOL 10 MG/ML IV BOLUS
INTRAVENOUS | Status: DC | PRN
  Administered 2014-08-29: 200 mg via INTRAVENOUS

## 2014-08-29 MED ORDER — PROMETHAZINE HCL 25 MG/ML IJ SOLN
6.2500 mg | INTRAMUSCULAR | Status: DC | PRN
  Filled 2014-08-29: qty 1

## 2014-08-29 MED ORDER — KETOROLAC TROMETHAMINE 30 MG/ML IJ SOLN
30.0000 mg | Freq: Once | INTRAMUSCULAR | Status: AC
  Administered 2014-08-29: 30 mg via INTRAVENOUS

## 2014-08-29 MED ORDER — GLYCOPYRROLATE 0.2 MG/ML IJ SOLN
INTRAMUSCULAR | Status: DC | PRN
  Administered 2014-08-29: 0.2 mg via INTRAVENOUS

## 2014-08-29 MED ORDER — OXYCODONE HCL 5 MG/5ML PO SOLN: 5.0000 mg | Freq: Once | ORAL | Status: DC | PRN

## 2014-08-29 MED ORDER — FENTANYL CITRATE 0.05 MG/ML IJ SOLN
100.0000 ug | Freq: Once | INTRAMUSCULAR | Status: AC
  Administered 2014-08-29 (×2): 50 ug via INTRAVENOUS

## 2014-08-29 MED ORDER — FENTANYL CITRATE 0.05 MG/ML IJ SOLN
INTRAMUSCULAR | Status: DC | PRN
  Administered 2014-08-29: 25 ug via INTRAVENOUS

## 2014-08-29 MED ORDER — LACTATED RINGERS IV SOLN
INTRAVENOUS | Status: DC
  Administered 2014-08-29 (×2): via INTRAVENOUS

## 2014-08-29 SURGICAL SUPPLY — 42 items
ADAPTER CATH URET PLST 4-6FR (CATHETERS) IMPLANT
ADPR CATH URET STRL DISP 4-6FR (CATHETERS)
BAG DRAIN URO-CYSTO SKYTR STRL (DRAIN) ×5 IMPLANT
BAG DRN UROCATH (DRAIN) ×3
BASKET LASER NITINOL 1.9FR (BASKET) IMPLANT
BASKET STNLS GEMINI 4WIRE 3FR (BASKET) IMPLANT
BASKET ZERO TIP NITINOL 2.4FR (BASKET) IMPLANT
BSKT STON RTRVL 120 1.9FR (BASKET)
BSKT STON RTRVL GEM 120X11 3FR (BASKET)
BSKT STON RTRVL ZERO TP 2.4FR (BASKET)
CANISTER SUCT LVC 12 LTR MEDI- (MISCELLANEOUS) ×4 IMPLANT
CATH INTERMIT  6FR 70CM (CATHETERS) IMPLANT
CATH URET 5FR 28IN CONE TIP (BALLOONS)
CATH URET 5FR 28IN OPEN ENDED (CATHETERS) IMPLANT
CATH URET 5FR 70CM CONE TIP (BALLOONS) IMPLANT
CLOTH BEACON ORANGE TIMEOUT ST (SAFETY) ×5 IMPLANT
DRAPE CAMERA CLOSED 9X96 (DRAPES) ×4 IMPLANT
ELECT REM PT RETURN 9FT ADLT (ELECTROSURGICAL)
ELECTRODE REM PT RTRN 9FT ADLT (ELECTROSURGICAL) IMPLANT
GLOVE BIO SURGEON STRL SZ 6.5 (GLOVE) ×3 IMPLANT
GLOVE BIO SURGEON STRL SZ7 (GLOVE) ×4 IMPLANT
GLOVE BIO SURGEON STRL SZ7.5 (GLOVE) ×5 IMPLANT
GLOVE BIO SURGEONS STRL SZ 6.5 (GLOVE) ×1
GLOVE INDICATOR 6.5 STRL GRN (GLOVE) ×4 IMPLANT
GOWN PREVENTION PLUS LG XLONG (DISPOSABLE) ×1 IMPLANT
GOWN STRL REIN XL XLG (GOWN DISPOSABLE) ×1 IMPLANT
GOWN STRL REUS W/ TWL XL LVL3 (GOWN DISPOSABLE) ×2 IMPLANT
GOWN STRL REUS W/TWL XL LVL3 (GOWN DISPOSABLE) ×9 IMPLANT
GUIDEWIRE 0.038 PTFE COATED (WIRE) IMPLANT
GUIDEWIRE ANG ZIPWIRE 038X150 (WIRE) IMPLANT
GUIDEWIRE STR DUAL SENSOR (WIRE) ×5 IMPLANT
IV NS IRRIG 3000ML ARTHROMATIC (IV SOLUTION) ×10 IMPLANT
KIT BALLIN UROMAX 15FX10 (LABEL) IMPLANT
KIT BALLN UROMAX 15FX4 (MISCELLANEOUS) IMPLANT
KIT BALLN UROMAX 26 75X4 (MISCELLANEOUS)
PACK CYSTO (CUSTOM PROCEDURE TRAY) ×5 IMPLANT
SET HIGH PRES BAL DIL (LABEL)
SHEATH ACCESS URETERAL 38CM (SHEATH) IMPLANT
SHEATH ACCESS URETERAL 54CM (SHEATH) IMPLANT
SHEATH URET ACCESS 12FR/35CM (UROLOGICAL SUPPLIES) IMPLANT
SHEATH URET ACCESS 12FR/55CM (UROLOGICAL SUPPLIES) IMPLANT
SYRINGE IRR TOOMEY STRL 70CC (SYRINGE) IMPLANT

## 2014-08-29 NOTE — ED Provider Notes (Signed)
CSN: [id number]     Arrival date & time 08/29/14  4680 History   First MD Initiated Contact with Patient 08/29/14 0447     Chief Complaint  Patient presents with  . Flank Pain     (Consider location/radiation/quality/duration/timing/severity/associated sxs/prior Treatment) Patient is a 48 y.o. male presenting with flank pain. The history is provided by the patient.  Flank Pain This is a recurrent problem. The current episode started more than 2 days ago. The problem occurs constantly. The problem has not changed since onset.Pertinent negatives include no headaches and no shortness of breath. Nothing aggravates the symptoms. Nothing relieves the symptoms. He has tried nothing for the symptoms. The treatment provided no relief.  Seen by myself on 9/18 for stone.  Discharged with flomax and pain medication and states he saw Dr. Junious Silk and declined lithotripsy as it doesn't work and wanted surgery and he states he was told there were no beds to get the surgery and the pain medication refill from [facility] is not working and had to leave work this evening due to 2 or more days of intractable pain.    Past Medical History  Diagnosis Date  . Kidney stones   . Cancer   . Basal cell carcinoma   . Kidney stone   . Spinal stenosis    Past Surgical History  Procedure Laterality Date  . Mohs surgery     History reviewed. No pertinent family history. History  Substance Use Topics  . Smoking status: Current Every Day Smoker -- 2.00 packs/day    Types: Cigarettes  . Smokeless tobacco: Never Used  . Alcohol Use: No     Comment: occ    Review of Systems  Respiratory: Negative for shortness of breath.   Genitourinary: Positive for flank pain.  Neurological: Negative for headaches.  All other systems reviewed and are negative.     Allergies  Review of patient's allergies indicates no known allergies.  Home Medications   Prior to Admission medications   Medication Sig Start Date End  Date Taking? Authorizing Provider  naproxen (NAPROSYN) 375 MG tablet Take 1 tablet (375 mg total) by mouth 2 (two) times daily. 08/15/14   Yarimar Lavis K Jameya Pontiff-Rasch, MD  omeprazole (PRILOSEC) 20 MG capsule Take 20 mg by mouth daily.    Historical Provider, MD  ondansetron (ZOFRAN ODT) 8 MG disintegrating tablet 8mg  ODT q8 hours prn nausea 08/15/14   Deriona Altemose K Leverne Tessler-Rasch, MD  oxyCODONE-acetaminophen (PERCOCET) 5-325 MG per tablet Take 2 tablets by mouth every 4 (four) hours as needed for pain. 08/25/13   Veryl Speak, MD  oxyCODONE-acetaminophen (PERCOCET) 5-325 MG per tablet Take 1-2 tablets by mouth every 6 (six) hours as needed. 08/15/14   Tenae Graziosi K Miquel Lamson-Rasch, MD  tamsulosin (FLOMAX) 0.4 MG CAPS capsule Take 1 capsule (0.4 mg total) by mouth daily. 08/25/13   Veryl Speak, MD  tamsulosin (FLOMAX) 0.4 MG CAPS capsule Take 1 capsule (0.4 mg total) by mouth daily. 08/15/14   Davied Nocito K Diar Berkel-Rasch, MD  zolpidem (AMBIEN) 10 MG tablet Take 10 mg by mouth at bedtime as needed for sleep.    Historical Provider, MD   BP 147/87  Pulse 70  Temp(Src) 97.8 F (36.6 C) (Oral)  Resp 18  Ht 6' (1.829 m)  Wt 220 lb (99.791 kg)  BMI 29.83 kg/m2  SpO2 100% Physical Exam  Constitutional: He is oriented to person, place, and time. He appears well-developed and well-nourished. No distress.  HENT:  Head: Normocephalic and atraumatic.  Mouth/Throat:  Oropharynx is clear and moist.  Eyes: Conjunctivae are normal. Pupils are equal, round, and reactive to light.  Neck: Normal range of motion. Neck supple.  Cardiovascular: Normal rate, regular rhythm and intact distal pulses.   Pulmonary/Chest: Effort normal and breath sounds normal. He has no wheezes. He has no rales.  Abdominal: Soft. Bowel sounds are normal. There is no tenderness. There is no rebound and no guarding.  Musculoskeletal: Normal range of motion.  Neurological: He is alert and oriented to person, place, and time.  Skin: Skin is warm and dry.   Psychiatric: He has a normal mood and affect.    ED Course  Procedures (including critical care time) Labs Review Labs Reviewed  CBC WITH DIFFERENTIAL - Abnormal; Notable for the following:    Monocytes Absolute 1.2 (*)    All other components within normal limits  BASIC METABOLIC PANEL - Abnormal; Notable for the following:    Glucose, Bld 101 (*)    GFR calc non Af Amer 77 (*)    GFR calc Af Amer 89 (*)    Anion gap 16 (*)    All other components within normal limits  URINALYSIS, ROUTINE W REFLEX MICROSCOPIC - Abnormal; Notable for the following:    Color, Urine RED (*)    APPearance CLOUDY (*)    Hgb urine dipstick LARGE (*)    Ketones, ur 15 (*)    Protein, ur 100 (*)    Leukocytes, UA SMALL (*)    All other components within normal limits  URINE MICROSCOPIC-ADD ON - Abnormal; Notable for the following:    Squamous Epithelial / LPF FEW (*)    All other components within normal limits    Imaging Review Ct Renal Stone Study  08/29/2014   CLINICAL DATA:  Right.  History of bones.  EXAM: CT ABDOMEN AND PELVIS WITHOUT CONTRAST  TECHNIQUE: Multidetector CT imaging of the abdomen and pelvis was performed following the standard protocol without IV contrast.  COMPARISON:  The lung bases are clear.  5 mm stone in the distal right ureter at the ureterovesical junction. This stone has progressed distally since the previous study. There is still hydronephrosis and hydroureter on the right with perirenal and periureteral stranding. Punctate size calculus in kidney. No obstruction on the left. Bladder wall is not thickened. No bladder stones.  Sub cm low change in the dome of the liver is unchanged since previous study and probably represents a small cyst. The unenhanced appearance of the spleen, gallbladder, pancreas, adrenal glands, abdominal aorta, inferior vena cava, and retroperitoneal lymph nodes is unremarkable. Stomach, small bowel, and colon are not abnormally distended. No free air or  free fluid in the abdomen.  Pelvis: Appendix is normal. Prostate gland is not enlarged but does contain calcification. No free or loculated pelvic fluid collections. No pelvic mass or lymphadenopathy. Degenerative changes in the lumbar spine. No destructive bone lesions appreciated.  FINDINGS: 5 mm stone now in the distal right ureter at the ureterovesical junction, demonstrating inferior progression since previous study. Moderate proximal obstruction.   Electronically Signed   By: Lucienne Capers M.D.   On: 08/29/2014 05:14     EKG Interpretation None      MDM   Final diagnoses:  Kidney stone    Medications  HYDROmorphone (DILAUDID) injection 1 mg (not administered)  HYDROmorphone (DILAUDID) injection 1 mg (1 mg Intravenous Given 08/29/14 0507)  ketorolac (TORADOL) 30 MG/ML injection 30 mg (30 mg Intravenous Given 08/29/14 0500)  ondansetron (ZOFRAN)  injection 4 mg (4 mg Intravenous Given 08/29/14 0500)  ondansetron (ZOFRAN) 4 MG/2ML injection (  Duplicate 51/1/02 1117)  ketorolac (TORADOL) 30 MG/ML injection (  Duplicate 35/6/70 1410)  NPO pending likely extraction    440 am case d/w Dr. Risa Grill due to intractable pain send to [facility] to be seen by morning urologist.    550 case d/w Dr. Venora Maples aware of transfer  Alazae Crymes Alfonso Patten, MD 08/29/14 [phone]

## 2014-08-29 NOTE — Op Note (Signed)
Preoperative diagnosis: Right distal ureteral calculus  Postoperative diagnosis: Same  Procedure:  1. Cystoscopy 2. Removal of foreign body (right distal ureteral stone) 3. Right retrograde pyelogram.  Surgeon: Dr. Festus Aloe  Resident: Milon Score, MD  Anesthesia: General  Complications: None  Intraoperative findings: Right distal ureteral stone was crowning into the bladder. Was able to be dislodged by the tip of the cystoscope and removed from the bladder.  Right retrograde pyelography demonstrated no filling defects or hydronephrosis.  EBL: Minimal  Specimens: 1. Right ureteral calculus  Disposition of specimens: [facility] for stone analysis  Indication: Tristan Wood is a 48 y.o. male patient with urolithiasis. After reviewing the management options for treatment, they elected to proceed with the above surgical procedure(s). We have discussed the potential benefits and risks of the procedure, side effects of the proposed treatment, the likelihood of the patient achieving the goals of the procedure, and any potential problems that might occur during the procedure or recuperation. Informed consent has been obtained.  Description of procedure:  The patient was taken to the operating room and general anesthesia was induced.  The patient was placed in the dorsal lithotomy position, prepped and draped in the usual sterile fashion, and preoperative antibiotics were administered. A preoperative time-out was performed.   Cystourethroscopy was performed.  The patient's urethra was examined and was normal. The bladder was then systematically examined in its entirety. There was no evidence for any bladder tumors, stones, or other mucosal pathology.    Attention then turned to the Right ureteral orifice where the stone was visible at the edge of the UO. The tip of the cystoscope was used to dislodge it and brisk efflux of urine was visualized.  Omnipaque  contrast was injected through the ureteral catheter and a retrograde pyelogram was performed with findings as dictated above. Brisk antegrade efflux was observed and contrast was cleared from the ureter rapidly.   The bladder was then emptied and the procedure ended.  The patient appeared to tolerate the procedure well and without complications.  The patient was able to be awakened and transferred to the recovery unit in satisfactory condition.

## 2014-08-29 NOTE — Consult Note (Signed)
Pt seen and examined by me. I agree with H&P, A&P from Dr. Wynetta Emery. I reviewed the notes, labs and images. I discussed with the patient the nature, potential benefits, risks and alternatives to right URS, HLL, stent, including side effects of the proposed treatment, the likelihood of the patient achieving the goals of the procedure, and any potential problems that might occur during the procedure or recuperation. We discussed possible need for staged procedure, prestenting and even failure to gain RG access. All questions answered. Patient elects to proceed. He's had no dysuria, fever or chills.

## 2014-08-29 NOTE — ED Notes (Signed)
Patient transported to CT 

## 2014-08-29 NOTE — ED Notes (Signed)
Reports flank pain, is being followed by urologist, but unable to get stone retrieval surgery scheduled and now has developed significant pain

## 2014-08-29 NOTE — ED Notes (Signed)
Bed: WA06 Expected date:  Expected time:  Means of arrival:  Comments: tx from [place]/5 mm stone/

## 2014-08-29 NOTE — Discharge Instructions (Signed)
1. You may see some blood in the urine and may have some burning with urination for 48-72 hours. You also may notice that you have to urinate more frequently or urgently after your procedure which is normal.  2. You should call should you develop an inability urinate, fever > 101, persistent nausea and vomiting that prevents you from eating or drinking to stay hydrated.    Dietary Guidelines to Help Prevent Kidney Stones Your risk of kidney stones can be decreased by adjusting the foods you eat. The most important thing you can do is drink enough fluid. You should drink enough fluid to keep your urine clear or pale yellow. The following guidelines provide specific information for the type of kidney stone you have had. GUIDELINES ACCORDING TO TYPE OF KIDNEY STONE Calcium Oxalate Kidney Stones  Reduce the amount of salt you eat. Foods that have a lot of salt cause your body to release excess calcium into your urine. The excess calcium can combine with a substance called oxalate to form kidney stones.  Reduce the amount of animal protein you eat if the amount you eat is excessive. Animal protein causes your body to release excess calcium into your urine. Ask your dietitian how much protein from animal sources you should be eating.  Avoid foods that are high in oxalates. If you take vitamins, they should have less than 500 mg of vitamin C. Your body turns vitamin C into oxalates. You do not need to avoid fruits and vegetables high in vitamin C. Calcium Phosphate Kidney Stones  Reduce the amount of salt you eat to help prevent the release of excess calcium into your urine.  Reduce the amount of animal protein you eat if the amount you eat is excessive. Animal protein causes your body to release excess calcium into your urine. Ask your dietitian how much protein from animal sources you should be eating.  Get enough calcium from food or take a calcium supplement (ask your dietitian for recommendations).  Food sources of calcium that do not increase your risk of kidney stones include:  Broccoli.  Dairy products, such as cheese and yogurt.  Pudding. Uric Acid Kidney Stones  Do not have more than 6 oz of animal protein per day. FOOD SOURCES Animal Protein Sources  Meat (all types).  Poultry.  Eggs.  Fish, seafood. Foods High in [employer] seasonings.  Soy sauce.  Teriyaki sauce.  Cured and processed meats.  Salted crackers and snack foods.  Fast food.  Canned soups and most canned foods. Foods High in Oxalates  Grains:  Amaranth.  Barley.  Grits.  Wheat germ.  Bran.  Buckwheat flour.  All bran cereals.  Pretzels.  Whole wheat bread.  Vegetables:  Beans (wax).  Beets and beet greens.  Collard greens.  Eggplant.  [place].  Leeks.  Okra.  Parsley.  Rutabagas.  Spinach.  Swiss chard.  Tomato paste.  Fried potatoes.  Sweet potatoes.  Fruits:  Red currants.  Figs.  Kiwi.  Rhubarb.  Meat and Other Protein Sources:  Beans (dried).  Soy burgers and other soybean products.  Miso.  Nuts (peanuts, almonds, pecans, cashews, hazelnuts).  Nut butters.  Sesame seeds and tahini (paste made of sesame seeds).  Poppy seeds.  Beverages:  Chocolate drink mixes.  Soy milk.  Instant iced tea.  Juices made from high-oxalate fruits or vegetables.  Other:  Carob.  Chocolate.  Fruitcake.  Marmalades. Document Released: 03/11/2011 Document Revised: 11/19/2013 Document Reviewed: 10/11/2013 ExitCare Patient Information  2015 [employer]. This information is not intended to replace advice given to you by your health care provider. Make sure you discuss any questions you have with your health care provider.   Post Anesthesia Home Care Instructions  Activity: Get plenty of rest for the remainder of the day. A responsible adult should stay with you for 24 hours following the procedure.  For the next 24 hours, DO  NOT: -Drive a car -Paediatric nurse -Drink alcoholic beverages -Take any medication unless instructed by your physician -Make any legal decisions or sign important papers.  Meals: Start with liquid foods such as gelatin or soup. Progress to regular foods as tolerated. Avoid greasy, spicy, heavy foods. If nausea and/or vomiting occur, drink only clear liquids until the nausea and/or vomiting subsides. Call your physician if vomiting continues.  Special Instructions/Symptoms: Your throat may feel dry or sore from the anesthesia or the breathing tube placed in your throat during surgery. If this causes discomfort, gargle with warm salt water. The discomfort should disappear within 24 hours. CYSTOSCOPY HOME CARE INSTRUCTIONS  Activity: Rest for the remainder of the day.  Do not drive or operate equipment today.  You may resume normal activities in one to two days as instructed by your physician.   Meals: Drink plenty of liquids and eat light foods such as gelatin or soup this evening.  You may return to a normal meal plan tomorrow.  Return to Work: You may return to work in one to two days or as instructed by your physician.  Special Instructions / Symptoms: Call your physician if any of these symptoms occur:   -persistent or heavy bleeding  -bleeding which continues after first few urination  -large blood clots that are difficult to pass  -urine stream diminishes or stops completely  -fever equal to or higher than 101 degrees Farenheit.  -cloudy urine with a strong, foul odor  -severe pain  Females should always wipe from front to back after elimination.  You may feel some burning pain when you urinate.  This should disappear with time.  Applying moist heat to the lower abdomen or a hot tub bath may help relieve the pain. \  Follow-Up / Date of Return Visit to Your Physician:  *** Call for an appointment to arrange follow-up.  Patient Signature:   ________________________________________________________  Nurse's Signature:  ________________________________________________________

## 2014-08-29 NOTE — Anesthesia Procedure Notes (Signed)
Procedure Name: LMA Insertion Date/Time: 08/29/2014 1:49 PM Performed by: Mechele Claude Pre-anesthesia Checklist: Patient identified, Emergency Drugs available, Suction available and Patient being monitored Patient Re-evaluated:Patient Re-evaluated prior to inductionOxygen Delivery Method: Circle System Utilized Preoxygenation: Pre-oxygenation with 100% oxygen Intubation Type: IV induction Ventilation: Mask ventilation without difficulty LMA: LMA inserted LMA Size: 5.0 Number of attempts: 1 Airway Equipment and Method: bite block Placement Confirmation: positive ETCO2 Tube secured with: Tape Dental Injury: Teeth and Oropharynx as per pre-operative assessment

## 2014-08-29 NOTE — ED Notes (Signed)
Pt reports burning and blood tinged urine with urination.

## 2014-08-29 NOTE — ED Notes (Signed)
Called [facility] for urology

## 2014-08-29 NOTE — ED Notes (Signed)
Pt refused lithotripsy and was managed medically for the kidney stone found on the 18th of September. Pt came back to ED due to increasing pain.

## 2014-08-29 NOTE — Anesthesia Preprocedure Evaluation (Addendum)
Anesthesia Evaluation  Patient identified by MRN, date of birth, ID band Patient awake    Reviewed: Allergy & Precautions, H&P , NPO status , Patient's Chart, lab work & pertinent test results  Airway Mallampati: II TM Distance: >3 FB Neck ROM: Full    Dental no notable dental hx.    Pulmonary Current Smoker,  breath sounds clear to auscultation  Pulmonary exam normal       Cardiovascular negative cardio ROS  Rhythm:Regular Rate:Normal     Neuro/Psych negative neurological ROS  negative psych ROS   GI/Hepatic negative GI ROS, Neg liver ROS, Medicated,  Endo/Other  negative endocrine ROS  Renal/GU Renal disease     Musculoskeletal negative musculoskeletal ROS (+)   Abdominal   Peds  Hematology negative hematology ROS (+)   Anesthesia Other Findings   Reproductive/Obstetrics                          Anesthesia Physical Anesthesia Plan  ASA: II  Anesthesia Plan: General   Post-op Pain Management:    Induction: Intravenous  Airway Management Planned: LMA  Additional Equipment:   Intra-op Plan:   Post-operative Plan: Extubation in OR  Informed Consent: I have reviewed the patients History and Physical, chart, labs and discussed the procedure including the risks, benefits and alternatives for the proposed anesthesia with the patient or authorized representative who has indicated his/her understanding and acceptance.   Dental advisory given  Plan Discussed with: CRNA  Anesthesia Plan Comments:         Anesthesia Quick Evaluation

## 2014-08-29 NOTE — Transfer of Care (Signed)
Immediate Anesthesia Transfer of Care Note  Patient: Tristan Wood  Procedure(s) Performed: Procedure(s) (LRB): CYSTOSCOPY WITH RETROGRADE PYELOGRAM, URETEROSCOPY, STONE REMOVAL (Right)  Patient Location: PACU  Anesthesia Type: General  Level of Consciousness: sleepy Airway & Oxygen Therapy: Patient Spontanous Breathing and Patient connected to face mask oxygen, oral airway remaining  Post-op Assessment: Report given to PACU RN and Post -op Vital signs reviewed and stable  Post vital signs: Reviewed and stable  Complications: No apparent anesthesia complications

## 2014-08-29 NOTE — ED Notes (Signed)
Surgery scheduled for 1300. Take patient for preop at 1100.

## 2014-08-29 NOTE — Consult Note (Signed)
Urology Consult   Physician requesting consult: ED  Reason for consult: Distal right ureteral stone  History of Present Illness: Tristan Wood is a 48 y.o. male with a known right ureteral stone. He presented initially with a proximal 54m stone on 9/18. He underwent trial of MET. He was initially doing well but his pain became intolerable last night and he presented to the ER. Repeat CT scan showed the stone had migrated to the UVJ. He still has significant hydronephrosis. He also reports hematuria and nausea but no vomiting. He has received a fair amount of IV pain meds in the ER.   He has a long history of nephrolithiasis.  Past Medical History  Diagnosis Date  . Kidney stones   . Cancer   . Basal cell carcinoma   . Kidney stone   . Spinal stenosis     Past Surgical History  Procedure Laterality Date  . Mohs surgery      Medications:  Home meds:    Medication List    ASK your doctor about these medications       CLEAR EYES FOR DRY EYES 1-0.25 % Soln  Generic drug:  Carboxymethylcellul-Glycerin  Apply 2 drops to eye 3 (three) times daily as needed (for dry eyes).     naproxen 375 MG tablet  Commonly known as:  NAPROSYN  Take 1 tablet (375 mg total) by mouth 2 (two) times daily.     omeprazole 20 MG capsule  Commonly known as:  PRILOSEC  Take 20 mg by mouth daily.     ondansetron 8 MG disintegrating tablet  Commonly known as:  ZOFRAN-ODT  Take 8 mg by mouth every 8 (eight) hours as needed for nausea.     oxyCODONE-acetaminophen 5-325 MG per tablet  Commonly known as:  PERCOCET  Take 1-2 tablets by mouth every 6 (six) hours as needed.     tamsulosin 0.4 MG Caps capsule  Commonly known as:  FLOMAX  Take 1 capsule (0.4 mg total) by mouth daily.     zolpidem 10 MG tablet  Commonly known as:  AMBIEN  Take 10 mg by mouth at bedtime as needed for sleep.        Scheduled Meds: .  HYDROmorphone (DILAUDID) injection  2 mg Intravenous Once   Continuous  Infusions:  PRN Meds:.  Allergies: No Known Allergies  History reviewed. No pertinent family history.  Social History:  reports that he has been smoking Cigarettes.  He has been smoking about 2.00 packs per day. He has never used smokeless tobacco. He reports that he does not drink alcohol or use illicit drugs.  ROS: A complete review of systems was performed.  All systems are negative except for pertinent findings as noted.  Physical Exam:  Vital signs in last 24 hours: Temp:  [97.8 F (36.6 C)] 97.8 F (36.6 C) (10/02 0440) Pulse Rate:  [66-72] 72 (10/02 0625) Resp:  [18-21] 21 (10/02 0625) BP: (109-147)/(73-87) 109/73 mmHg (10/02 0625) SpO2:  [98 %-100 %] 98 % (10/02 0625) Weight:  [99.791 kg (220 lb)] 99.791 kg (220 lb) (10/02 0440) Constitutional:  Alert and oriented, No acute distress Cardiovascular: Regular rate and rhythm, No JVD Respiratory: Normal respiratory effort, Lungs clear bilaterally GI: Abdomen is soft, nontender, nondistended, no abdominal masses Genitourinary: Mild right CVAT. Normal male phallus, testes are descended bilaterally and non-tender and without masses, scrotum is normal in appearance without lesions or masses, perineum is normal on inspection. Lymphatic: No lymphadenopathy Neurologic: Grossly intact,  no focal deficits Psychiatric: Normal mood and affect  Laboratory Data:   Recent Labs  08/29/14 0500  WBC 10.4  HGB 15.3  HCT 43.0  PLT 391     Recent Labs  08/29/14 0500  NA 139  K 4.8  CL 104  GLUCOSE 101*  BUN 20  CALCIUM 9.3  CREATININE 1.10   Urinalysis    Component Value Date/Time   COLORURINE RED* 08/29/2014 0500   APPEARANCEUR CLOUDY* 08/29/2014 0500   LABSPEC 1.023 08/29/2014 0500   PHURINE 5.5 08/29/2014 0500   GLUCOSEU NEGATIVE 08/29/2014 0500   HGBUR LARGE* 08/29/2014 0500   BILIRUBINUR NEGATIVE 08/29/2014 0500   KETONESUR 15* 08/29/2014 0500   PROTEINUR 100* 08/29/2014 0500   UROBILINOGEN 0.2 08/29/2014 0500   NITRITE  NEGATIVE 08/29/2014 0500   LEUKOCYTESUR SMALL* 08/29/2014 0500       Radiologic Imaging: Ct Renal Stone Study  08/29/2014   CLINICAL DATA:  Right.  History of bones.  EXAM: CT ABDOMEN AND PELVIS WITHOUT CONTRAST  TECHNIQUE: Multidetector CT imaging of the abdomen and pelvis was performed following the standard protocol without IV contrast.  COMPARISON:  The lung bases are clear.  5 mm stone in the distal right ureter at the ureterovesical junction. This stone has progressed distally since the previous study. There is still hydronephrosis and hydroureter on the right with perirenal and periureteral stranding. Punctate size calculus in kidney. No obstruction on the left. Bladder wall is not thickened. No bladder stones.  Sub cm low change in the dome of the liver is unchanged since previous study and probably represents a small cyst. The unenhanced appearance of the spleen, gallbladder, pancreas, adrenal glands, abdominal aorta, inferior vena cava, and retroperitoneal lymph nodes is unremarkable. Stomach, small bowel, and colon are not abnormally distended. No free air or free fluid in the abdomen.  Pelvis: Appendix is normal. Prostate gland is not enlarged but does contain calcification. No free or loculated pelvic fluid collections. No pelvic mass or lymphadenopathy. Degenerative changes in the lumbar spine. No destructive bone lesions appreciated.  FINDINGS: 5 mm stone now in the distal right ureter at the ureterovesical junction, demonstrating inferior progression since previous study. Moderate proximal obstruction.   Electronically Signed   By: Lucienne Capers M.D.   On: 08/29/2014 05:14    I independently reviewed the above imaging studies.  Impression/Recommendation 61M with distal right ureteral stone and hydronephrosis. He has thus far been unsuccessful with his trial of medical expulsive therapy and is having severe intractable pain preventing him from working. He requests definitive  intervention despite the stone being on the verge of passage into his bladder. Risks and benefits of surgery vs continued MET were discussed and he requests intervention.  -- will post for right ureteroscopic stone extraction this am as early as possible today -- NPO until then  Discussed with Dr. Junious Silk and he agrees.

## 2014-08-29 NOTE — ED Notes (Signed)
Pt went outside to smoke. Pt reports "I did not think I would get caught." Pt advised to not leave department would be discharged AMA per PA.

## 2014-08-29 NOTE — Op Note (Signed)
I was present and scrubbed for the entire procedure.

## 2014-09-01 ENCOUNTER — Encounter (HOSPITAL_BASED_OUTPATIENT_CLINIC_OR_DEPARTMENT_OTHER): Payer: Self-pay | Admitting: Urology

## 2014-09-02 NOTE — Anesthesia Postprocedure Evaluation (Deleted)
  Anesthesia Post Note  Patient: Tristan Wood  Procedure(s) Performed: Procedure(s) (LRB): CYSTOSCOPY WITH RETROGRADE PYELOGRAM, URETEROSCOPY, STONE REMOVAL (Right)  Anesthesia type: GA  Patient location: PACU  Post pain: Pain level controlled  Post assessment: Post-op Vital signs reviewed  Last Vitals:  Filed Vitals:   08/29/14 1550  BP: 126/77  Pulse: 55  Temp: 36.5 C  Resp: 16    Post vital signs: Reviewed  Level of consciousness: sedated  Complications: No apparent anesthesia complications

## 2014-09-02 NOTE — Addendum Note (Signed)
Addendum created 09/02/14 1256 by Rudean Curt, MD   Modules edited: Notes Section   Notes Section:  Delete: [id number]

## 2015-04-07 ENCOUNTER — Ambulatory Visit
Admission: RE | Admit: 2015-04-07 | Discharge: 2015-04-07 | Disposition: A | Discharge status: Home / Self Care | Payer: Self-pay | Source: Ambulatory Visit | Attending: Family Medicine | Admitting: Family Medicine

## 2015-04-07 ENCOUNTER — Other Ambulatory Visit: Payer: Self-pay | Admitting: Family Medicine

## 2015-04-07 DIAGNOSIS — R05 Cough: Secondary | ICD-10-CM

## 2015-04-07 DIAGNOSIS — R053 Chronic cough: Secondary | ICD-10-CM

## 2015-07-25 ENCOUNTER — Emergency Department (HOSPITAL_BASED_OUTPATIENT_CLINIC_OR_DEPARTMENT_OTHER): Payer: Commercial Managed Care - HMO

## 2015-07-25 ENCOUNTER — Emergency Department (HOSPITAL_BASED_OUTPATIENT_CLINIC_OR_DEPARTMENT_OTHER)
Admission: EM | Admit: 2015-07-25 | Discharge: 2015-07-25 | Disposition: A | Discharge status: Home / Self Care | Payer: Commercial Managed Care - HMO | Attending: Physician Assistant | Admitting: Physician Assistant

## 2015-07-25 ENCOUNTER — Encounter (HOSPITAL_BASED_OUTPATIENT_CLINIC_OR_DEPARTMENT_OTHER): Payer: Self-pay | Admitting: *Deleted

## 2015-07-25 DIAGNOSIS — Z8739 Personal history of other diseases of the musculoskeletal system and connective tissue: Secondary | ICD-10-CM | POA: Insufficient documentation

## 2015-07-25 DIAGNOSIS — Z85828 Personal history of other malignant neoplasm of skin: Secondary | ICD-10-CM | POA: Diagnosis not present

## 2015-07-25 DIAGNOSIS — N201 Calculus of ureter: Secondary | ICD-10-CM | POA: Diagnosis not present

## 2015-07-25 DIAGNOSIS — Z720 Tobacco use: Secondary | ICD-10-CM | POA: Diagnosis not present

## 2015-07-25 DIAGNOSIS — N23 Unspecified renal colic: Secondary | ICD-10-CM

## 2015-07-25 DIAGNOSIS — R109 Unspecified abdominal pain: Secondary | ICD-10-CM | POA: Diagnosis present

## 2015-07-25 DIAGNOSIS — Z79899 Other long term (current) drug therapy: Secondary | ICD-10-CM | POA: Diagnosis not present

## 2015-07-25 LAB — COMPREHENSIVE METABOLIC PANEL
ALBUMIN: 3.8 g/dL (ref 3.5–5.0)
ALT: 27 U/L (ref 17–63)
AST: 19 U/L (ref 15–41)
Alkaline Phosphatase: 71 U/L (ref 38–126)
Anion gap: 10 (ref 5–15)
BUN: 15 mg/dL (ref 6–20)
CHLORIDE: 104 mmol/L (ref 101–111)
CO2: 25 mmol/L (ref 22–32)
CREATININE: 0.99 mg/dL (ref 0.61–1.24)
Calcium: 9.2 mg/dL (ref 8.9–10.3)
GFR calc Af Amer: >60 mL/min (ref >60)
GFR calc non Af Amer: >60 mL/min (ref >60)
GLUCOSE: 98 mg/dL (ref 65–99)
POTASSIUM: 4.1 mmol/L (ref 3.5–5.1)
Sodium: 139 mmol/L (ref 135–145)
Total Bilirubin: 0.5 mg/dL (ref 0.3–1.2)
Total Protein: 6.7 g/dL (ref 6.5–8.1)

## 2015-07-25 LAB — CBC WITH DIFFERENTIAL/PLATELET
BASOS ABS: 0 10*3/uL (ref 0.0–0.1)
BASOS PCT: 0 % (ref 0–1)
Eosinophils Absolute: 0.4 10*3/uL (ref 0.0–0.7)
Eosinophils Relative: 4 % (ref 0–5)
HCT: 44.5 % (ref 39.0–52.0)
Hemoglobin: 15.6 g/dL (ref 13.0–17.0)
LYMPHS PCT: 34 % (ref 12–46)
Lymphs Abs: 3.4 10*3/uL (ref 0.7–4.0)
MCH: 30.1 pg (ref 26.0–34.0)
MCHC: 35.1 g/dL (ref 30.0–36.0)
MCV: 85.9 fL (ref 78.0–100.0)
MONO ABS: 1.2 10*3/uL — AB (ref 0.1–1.0)
Monocytes Relative: 12 % (ref 3–12)
Neutro Abs: 4.9 10*3/uL (ref 1.7–7.7)
Neutrophils Relative %: 50 % (ref 43–77)
Platelets: 347 10*3/uL (ref 150–400)
RBC: 5.18 MIL/uL (ref 4.22–5.81)
RDW: 13.3 % (ref 11.5–15.5)
WBC: 9.9 10*3/uL (ref 4.0–10.5)

## 2015-07-25 LAB — URINALYSIS, ROUTINE W REFLEX MICROSCOPIC
BILIRUBIN URINE: NEGATIVE
Glucose, UA: NEGATIVE mg/dL
Ketones, ur: NEGATIVE mg/dL
Leukocytes, UA: NEGATIVE
Nitrite: NEGATIVE
PH: 5 (ref 5.0–8.0)
Protein, ur: NEGATIVE mg/dL
SPECIFIC GRAVITY, URINE: 1.021 (ref 1.005–1.030)
Urobilinogen, UA: 0.2 mg/dL (ref 0.0–1.0)

## 2015-07-25 LAB — URINE MICROSCOPIC-ADD ON

## 2015-07-25 MED ORDER — ONDANSETRON 8 MG PO TBDP: 8.0000 mg | ORAL_TABLET | Freq: Three times a day (TID) | ORAL | Status: DC | PRN

## 2015-07-25 MED ORDER — OXYCODONE-ACETAMINOPHEN 5-325 MG PO TABS: 1.0000 | ORAL_TABLET | Freq: Four times a day (QID) | ORAL | Status: DC | PRN

## 2015-07-25 MED ORDER — KETOROLAC TROMETHAMINE 30 MG/ML IJ SOLN
30.0000 mg | Freq: Once | INTRAMUSCULAR | Status: AC
  Administered 2015-07-25: 30 mg via INTRAVENOUS
  Filled 2015-07-25: qty 1

## 2015-07-25 MED ORDER — TAMSULOSIN HCL 0.4 MG PO CAPS: 0.4000 mg | ORAL_CAPSULE | Freq: Every day | ORAL | Status: DC

## 2015-07-25 MED ORDER — FENTANYL CITRATE (PF) 100 MCG/2ML IJ SOLN
100.0000 ug | INTRAMUSCULAR | Status: DC | PRN
  Administered 2015-07-25: 100 ug via INTRAVENOUS
  Filled 2015-07-25: qty 2

## 2015-07-25 MED ORDER — HYDROMORPHONE HCL 1 MG/ML IJ SOLN
1.0000 mg | Freq: Once | INTRAMUSCULAR | Status: AC
  Administered 2015-07-25: 1 mg via INTRAVENOUS
  Filled 2015-07-25: qty 1

## 2015-07-25 MED ORDER — ONDANSETRON HCL 4 MG/2ML IJ SOLN
4.0000 mg | Freq: Once | INTRAMUSCULAR | Status: AC
  Administered 2015-07-25: 4 mg via INTRAVENOUS
  Filled 2015-07-25: qty 2

## 2015-07-25 MED ORDER — FENTANYL CITRATE (PF) 100 MCG/2ML IJ SOLN: 50.0000 ug | INTRAMUSCULAR | Status: DC | PRN

## 2015-07-25 NOTE — ED Notes (Signed)
Left flank pain since 0400- hx of kidney stones

## 2015-07-25 NOTE — Discharge Instructions (Signed)
1. Medications: Percocet, Flomax, Zofran, usual home medications 2. Treatment: rest, drink plenty of fluids,  3. Follow Up: Please followup with your primary doctor and/or urology in 2-3 days for discussion of your diagnoses and further evaluation after today's visit; if you do not have a primary care doctor use the resource guide provided to find one; Please return to the ER for worsening pain, high fevers or persistent vomiting.   Kidney Stones Kidney stones (urolithiasis) are deposits that form inside your kidneys. The intense pain is caused by the stone moving through the urinary tract. When the stone moves, the ureter goes into spasm around the stone. The stone is usually passed in the urine.  CAUSES   A disorder that makes certain neck glands produce too much parathyroid hormone (primary hyperparathyroidism).  A buildup of uric acid crystals, similar to gout in your joints.  Narrowing (stricture) of the ureter.  A kidney obstruction present at birth (congenital obstruction).  Previous surgery on the kidney or ureters.  Numerous kidney infections. SYMPTOMS   Feeling sick to your stomach (nauseous).  Throwing up (vomiting).  Blood in the urine (hematuria).  Pain that usually spreads (radiates) to the groin.  Frequency or urgency of urination. DIAGNOSIS   Taking a history and physical exam.  Blood or urine tests.  CT scan.  Occasionally, an examination of the inside of the urinary bladder (cystoscopy) is performed. TREATMENT   Observation.  Increasing your fluid intake.  Extracorporeal shock wave lithotripsy--This is a noninvasive procedure that uses shock waves to break up kidney stones.  Surgery may be needed if you have severe pain or persistent obstruction. There are various surgical procedures. Most of the procedures are performed with the use of small instruments. Only small incisions are needed to accommodate these instruments, so recovery time is  minimized. The size, location, and chemical composition are all important variables that will determine the proper choice of action for you. Talk to your health care provider to better understand your situation so that you will minimize the risk of injury to yourself and your kidney.  HOME CARE INSTRUCTIONS   Drink enough water and fluids to keep your urine clear or pale yellow. This will help you to pass the stone or stone fragments.  Strain all urine through the provided strainer. Keep all particulate matter and stones for your health care provider to see. The stone causing the pain may be as small as a grain of salt. It is very important to use the strainer each and every time you pass your urine. The collection of your stone will allow your health care provider to analyze it and verify that a stone has actually passed. The stone analysis will often identify what you can do to reduce the incidence of recurrences.  Only take over-the-counter or prescription medicines for pain, discomfort, or fever as directed by your health care provider.  Make a follow-up appointment with your health care provider as directed.  Get follow-up X-rays if required. The absence of pain does not always mean that the stone has passed. It may have only stopped moving. If the urine remains completely obstructed, it can cause loss of kidney function or even complete destruction of the kidney. It is your responsibility to make sure X-rays and follow-ups are completed. Ultrasounds of the kidney can show blockages and the status of the kidney. Ultrasounds are not associated with any radiation and can be performed easily in a matter of minutes. SEEK MEDICAL CARE IF:  You experience pain that is progressive and unresponsive to any pain medicine you have been prescribed. SEEK IMMEDIATE MEDICAL CARE IF:   Pain cannot be controlled with the prescribed medicine.  You have a fever or shaking chills.  The severity or intensity  of pain increases over 18 hours and is not relieved by pain medicine.  You develop a new onset of abdominal pain.  You feel faint or pass out.  You are unable to urinate. MAKE SURE YOU:   Understand these instructions.  Will watch your condition.  Will get help right away if you are not doing well or get worse. Document Released: 11/14/2005 Document Revised: 07/17/2013 Document Reviewed: 04/17/2013 [facility] Patient Information 2015 [place], [place]. This information is not intended to replace advice given to you by your health care provider. Make sure you discuss any questions you have with your health care provider.

## 2015-07-25 NOTE — ED Provider Notes (Signed)
CSN: [id number]     Arrival date & time 07/25/15  1357 History   First MD Initiated Contact with Patient 07/25/15 1549     Chief Complaint  Patient presents with  . Flank Pain     (Consider location/radiation/quality/duration/timing/severity/associated sxs/prior Treatment) The history is provided by the patient and medical records. No language interpreter was used.     Tristan Wood is a 49 y.o. male  with a hx of kidney stones (surgical extraction in Oct 2015), basal cell carcinoma, spinal stenosis presents to the Emergency Department complaining of gradual, persistent, progressively worsening left flank pain onset 4 AM. Associated symptoms include nausea without vomiting.  No treatments prior to arrival here in the emergency department. No accurate or alleviating factors. He denies fever, chills, headache, neck pain, chest pain, shortness of breath, abdominal pain, vomiting, diarrhea, weakness, dizziness, syncope.     Past Medical History  Diagnosis Date  . Kidney stones   . Cancer   . Basal cell carcinoma   . Kidney stone   . Spinal stenosis    Past Surgical History  Procedure Laterality Date  . Mohs surgery    . Cystoscopy with retrograde pyelogram, ureteroscopy and stent placement Right 08/29/2014    Procedure: CYSTOSCOPY WITH RETROGRADE PYELOGRAM, URETEROSCOPY, STONE REMOVAL;  Surgeon: Festus Aloe, MD;  Location: [facility];  Service: Urology;  Laterality: Right;   No family history on file. Social History  Substance Use Topics  . Smoking status: Current Every Day Smoker -- 2.00 packs/day    Types: Cigarettes  . Smokeless tobacco: Never Used  . Alcohol Use: No    Review of Systems  Constitutional: Negative for fever, diaphoresis, appetite change, fatigue and unexpected weight change.  HENT: Negative for mouth sores.   Eyes: Negative for visual disturbance.  Respiratory: Negative for cough, chest tightness, shortness of breath and wheezing.    Cardiovascular: Negative for chest pain.  Gastrointestinal: Positive for nausea. Negative for vomiting, abdominal pain, diarrhea and constipation.  Endocrine: Negative for polydipsia, polyphagia and polyuria.  Genitourinary: Positive for flank pain (left). Negative for dysuria, urgency, frequency and hematuria.  Musculoskeletal: Negative for back pain and neck stiffness.  Skin: Negative for rash.  Allergic/Immunologic: Negative for immunocompromised state.  Neurological: Negative for syncope, light-headedness and headaches.  Hematological: Does not bruise/bleed easily.  Psychiatric/Behavioral: Negative for sleep disturbance. The patient is not nervous/anxious.       Allergies  Review of patient's allergies indicates no known allergies.  Home Medications   Prior to Admission medications   Medication Sig Start Date End Date Taking? Authorizing Provider  omeprazole (PRILOSEC) 20 MG capsule Take 20 mg by mouth daily.   Yes Historical Provider, MD  zolpidem (AMBIEN) 10 MG tablet Take 10 mg by mouth at bedtime as needed for sleep.   Yes Historical Provider, MD  ondansetron (ZOFRAN-ODT) 8 MG disintegrating tablet Take 1 tablet (8 mg total) by mouth every 8 (eight) hours as needed for nausea. 07/25/15   Prentice Sackrider, PA-C  oxyCODONE-acetaminophen (PERCOCET) 5-325 MG per tablet Take 1-2 tablets by mouth every 6 (six) hours as needed. 07/25/15   Emunah Texidor, PA-C  tamsulosin (FLOMAX) 0.4 MG CAPS capsule Take 1 capsule (0.4 mg total) by mouth daily. 07/25/15   Pauleen Goleman, PA-C   BP 125/74 mmHg  Pulse 60  Temp(Src) 98.2 F (36.8 C) (Oral)  Resp 16  Ht 6' (1.829 m)  Wt 220 lb (99.791 kg)  BMI 29.83 kg/m2  SpO2 97% Physical Exam  Constitutional: He appears well-developed and well-nourished. No distress.  HENT:  Head: Normocephalic and atraumatic.  Mouth/Throat: Oropharynx is clear and moist. No oropharyngeal exudate.  Cardiovascular: Normal rate, regular rhythm,  normal heart sounds and intact distal pulses.   Pulmonary/Chest: Effort normal and breath sounds normal. No respiratory distress. He has no wheezes.  Abdominal: Soft. Bowel sounds are normal. He exhibits no distension and no mass. There is no tenderness. There is CVA tenderness (left). There is no rebound and no guarding.  Abdomen is soft and nontender  Musculoskeletal: Normal range of motion. He exhibits no edema.  Neurological: He is alert.  Skin: Skin is warm and dry. No rash noted. He is not diaphoretic.  Psychiatric: He has a normal mood and affect.  Nursing note and vitals reviewed.   ED Course  Procedures (including critical care time) Labs Review Labs Reviewed  URINALYSIS, ROUTINE W REFLEX MICROSCOPIC (NOT AT [facility]) - Abnormal; Notable for the following:    Hgb urine dipstick MODERATE (*)    All other components within normal limits  URINE MICROSCOPIC-ADD ON - Abnormal; Notable for the following:    Crystals CA OXALATE CRYSTALS (*)    All other components within normal limits  CBC WITH DIFFERENTIAL/PLATELET - Abnormal; Notable for the following:    Monocytes Absolute 1.2 (*)    All other components within normal limits  COMPREHENSIVE METABOLIC PANEL    Imaging Review Ct Renal Stone Study  07/25/2015   CLINICAL DATA:  LEFT flank pain since 0400 hours today, hematuria, history kidney stones, smoking  EXAM: CT ABDOMEN AND PELVIS WITHOUT CONTRAST  TECHNIQUE: Multidetector CT imaging of the abdomen and pelvis was performed following the standard protocol without IV contrast. Sagittal and coronal MPR images reconstructed from axial data set. Oral contrast not administered for this indication  COMPARISON:  08/29/2014  FINDINGS: Lung bases clear.  Tiny nonobstructing calculus LEFT kidney image 30.  Tiny 1-2 mm diameter calculus at the distal LEFT ureter best appreciated on coronal image 68 without associated hydronephrosis or hydroureter.  Bladder and RIGHT ureter unremarkable.  Probable  small cyst at anterior RIGHT kidney 18 x 17 mm image 30.  Liver, gallbladder, spleen, pancreas, and adrenal glands normal.  Normal appendix.  Questionable rectal wall thickening versus artifact from underdistention.  Stomach and remaining bowel loops unremarkable.  Scattered atherosclerotic calcifications aorta.  No mass, adenopathy, free air, free fluid, hernia, or acute bone lesion.  IMPRESSION: Nonobstructing 1-2 mm distal LEFT ureteral calculus.  Additional tiny nonobstructing LEFT renal calculus.  Probable small RIGHT renal cyst.  Questionable rectal wall thickening versus artifact from underdistention, recommend correlation with proctoscopy to exclude rectal pathology including tumor.   Electronically Signed   By: Lavonia Dana M.D.   On: 07/25/2015 16:42   I have personally reviewed and evaluated these images and lab results as part of my medical decision-making.   EKG Interpretation None      MDM   Final diagnoses:  Ureteral calculus  Renal colic on left side   Tristan Wood presents with left flank pain and history of kidney stones. Patient reports pain is the same as previous. He has nausea without vomiting. No fevers. Patient's last and had to be removed surgically however he reports he did not wait as long this time.  5:03 PM CT scan with nonobstructing 1-2 mm distal left ureteral calculus.  Additionally there is a tiny nonobstructing left renal calculus.  No hydronephrosis.  Etiology makes comment about cholesterol rectal wall thickening  versus artifact and recommends correlation with proctoscopy. This incidental finding was discussed with patient he will follow-up with his primary care for further evaluation of this.  Pt with some improvement in pain.  Will redose pain control and d/c home.  No emesis.  No hydronephrosis.    Abigail Butts, PA-C 07/25/15 1758  Courteney Julio Alm, MD 07/26/15 1450

## 2015-09-14 ENCOUNTER — Emergency Department (HOSPITAL_COMMUNITY)
Admission: EM | Admit: 2015-09-14 | Discharge: 2015-09-14 | Disposition: A | Discharge status: Home / Self Care | Payer: Commercial Managed Care - HMO | Attending: Emergency Medicine | Admitting: Emergency Medicine

## 2015-09-14 ENCOUNTER — Emergency Department (HOSPITAL_COMMUNITY): Payer: Commercial Managed Care - HMO

## 2015-09-14 ENCOUNTER — Encounter (HOSPITAL_COMMUNITY): Payer: Self-pay | Admitting: *Deleted

## 2015-09-14 DIAGNOSIS — Z720 Tobacco use: Secondary | ICD-10-CM | POA: Insufficient documentation

## 2015-09-14 DIAGNOSIS — R109 Unspecified abdominal pain: Secondary | ICD-10-CM | POA: Diagnosis present

## 2015-09-14 DIAGNOSIS — N201 Calculus of ureter: Secondary | ICD-10-CM | POA: Diagnosis not present

## 2015-09-14 DIAGNOSIS — Z79899 Other long term (current) drug therapy: Secondary | ICD-10-CM | POA: Diagnosis not present

## 2015-09-14 DIAGNOSIS — Z85828 Personal history of other malignant neoplasm of skin: Secondary | ICD-10-CM | POA: Diagnosis not present

## 2015-09-14 DIAGNOSIS — Z8739 Personal history of other diseases of the musculoskeletal system and connective tissue: Secondary | ICD-10-CM | POA: Insufficient documentation

## 2015-09-14 LAB — CBC WITH DIFFERENTIAL/PLATELET
BASOS ABS: 0 10*3/uL (ref 0.0–0.1)
Basophils Relative: 0 %
EOS PCT: 2 %
Eosinophils Absolute: 0.2 10*3/uL (ref 0.0–0.7)
HEMATOCRIT: 41.9 % (ref 39.0–52.0)
Hemoglobin: 15.3 g/dL (ref 13.0–17.0)
Lymphocytes Relative: 28 %
Lymphs Abs: 2.8 10*3/uL (ref 0.7–4.0)
MCH: 31 pg (ref 26.0–34.0)
MCHC: 36.5 g/dL — ABNORMAL HIGH (ref 30.0–36.0)
MCV: 85 fL (ref 78.0–100.0)
MONO ABS: 1.2 10*3/uL — AB (ref 0.1–1.0)
MONOS PCT: 12 %
NEUTROS ABS: 5.8 10*3/uL (ref 1.7–7.7)
Neutrophils Relative %: 58 %
Platelets: 310 10*3/uL (ref 150–400)
RBC: 4.93 MIL/uL (ref 4.22–5.81)
RDW: 13 % (ref 11.5–15.5)
WBC: 10 10*3/uL (ref 4.0–10.5)

## 2015-09-14 LAB — COMPREHENSIVE METABOLIC PANEL
ALBUMIN: 4.5 g/dL (ref 3.5–5.0)
ALT: 25 U/L (ref 17–63)
ANION GAP: 8 (ref 5–15)
AST: 20 U/L (ref 15–41)
Alkaline Phosphatase: 74 U/L (ref 38–126)
BILIRUBIN TOTAL: 0.7 mg/dL (ref 0.3–1.2)
BUN: 14 mg/dL (ref 6–20)
CO2: 21 mmol/L — AB (ref 22–32)
Calcium: 9.1 mg/dL (ref 8.9–10.3)
Chloride: 109 mmol/L (ref 101–111)
Creatinine, Ser: 0.89 mg/dL (ref 0.61–1.24)
GFR calc Af Amer: >60 mL/min (ref >60)
GFR calc non Af Amer: >60 mL/min (ref >60)
GLUCOSE: 98 mg/dL (ref 65–99)
Potassium: 3.9 mmol/L (ref 3.5–5.1)
SODIUM: 138 mmol/L (ref 135–145)
TOTAL PROTEIN: 7.5 g/dL (ref 6.5–8.1)

## 2015-09-14 LAB — URINALYSIS, ROUTINE W REFLEX MICROSCOPIC
Bilirubin Urine: NEGATIVE
Glucose, UA: NEGATIVE mg/dL
Ketones, ur: NEGATIVE mg/dL
LEUKOCYTES UA: NEGATIVE
NITRITE: NEGATIVE
PH: 6 (ref 5.0–8.0)
Protein, ur: NEGATIVE mg/dL
SPECIFIC GRAVITY, URINE: 1.016 (ref 1.005–1.030)
UROBILINOGEN UA: 0.2 mg/dL (ref 0.0–1.0)

## 2015-09-14 LAB — URINE MICROSCOPIC-ADD ON

## 2015-09-14 MED ORDER — HYDROMORPHONE HCL 1 MG/ML IJ SOLN
1.0000 mg | Freq: Once | INTRAMUSCULAR | Status: AC
  Administered 2015-09-14: 1 mg via INTRAVENOUS
  Filled 2015-09-14: qty 1

## 2015-09-14 MED ORDER — TAMSULOSIN HCL 0.4 MG PO CAPS: 0.4000 mg | ORAL_CAPSULE | Freq: Every day | ORAL | Status: DC

## 2015-09-14 MED ORDER — OXYCODONE-ACETAMINOPHEN 5-325 MG PO TABS: 1.0000 | ORAL_TABLET | Freq: Four times a day (QID) | ORAL | Status: DC | PRN

## 2015-09-14 MED ORDER — OXYCODONE-ACETAMINOPHEN 5-325 MG PO TABS
2.0000 | ORAL_TABLET | Freq: Once | ORAL | Status: AC
  Administered 2015-09-14: 2 via ORAL
  Filled 2015-09-14: qty 2

## 2015-09-14 MED ORDER — ONDANSETRON HCL 4 MG/2ML IJ SOLN
4.0000 mg | Freq: Once | INTRAMUSCULAR | Status: AC
  Administered 2015-09-14: 4 mg via INTRAVENOUS
  Filled 2015-09-14: qty 2

## 2015-09-14 MED ORDER — SODIUM CHLORIDE 0.9 % IV BOLUS (SEPSIS)
1000.0000 mL | Freq: Once | INTRAVENOUS | Status: AC
  Administered 2015-09-14: 1000 mL via INTRAVENOUS

## 2015-09-14 MED ORDER — KETOROLAC TROMETHAMINE 30 MG/ML IJ SOLN
30.0000 mg | Freq: Once | INTRAMUSCULAR | Status: AC
  Administered 2015-09-14: 30 mg via INTRAVENOUS
  Filled 2015-09-14: qty 1

## 2015-09-14 MED ORDER — ONDANSETRON 4 MG PO TBDP: 4.0000 mg | ORAL_TABLET | Freq: Three times a day (TID) | ORAL | Status: DC | PRN

## 2015-09-14 NOTE — ED Provider Notes (Signed)
CSN: [id number]     Arrival date & time 09/14/15  8144 History   First MD Initiated Contact with Patient 09/14/15 [phone]     Chief Complaint  Patient presents with  . Flank Pain     (Consider location/radiation/quality/duration/timing/severity/associated sxs/prior Treatment) HPI Comments: 49 year old male with past medical history including kidney stones, Hcc, basal cell carcinoma, spinal stenosis who presents with left flank pain. The patient states that around 3 AM he woke up with severe left flank pain radiating down his left side into his left groin. The pain is constant and similar to previous episodes of kidney stone. He reports some hematuria and urinary hesitancy. He has had nausea but no vomiting. No diarrhea, chest pain, shortness of breath, or fevers.  Patient is a 49 y.o. male presenting with flank pain. The history is provided by the patient.  Flank Pain    Past Medical History  Diagnosis Date  . Kidney stones   . Cancer ([place])   . Basal cell carcinoma   . Kidney stone   . Spinal stenosis    Past Surgical History  Procedure Laterality Date  . Mohs surgery    . Cystoscopy with retrograde pyelogram, ureteroscopy and stent placement Right 08/29/2014    Procedure: CYSTOSCOPY WITH RETROGRADE PYELOGRAM, URETEROSCOPY, STONE REMOVAL;  Surgeon: Festus Aloe, MD;  Location: [facility];  Service: Urology;  Laterality: Right;   History reviewed. No pertinent family history. Social History  Substance Use Topics  . Smoking status: Current Every Day Smoker -- 2.00 packs/day    Types: Cigarettes  . Smokeless tobacco: Never Used  . Alcohol Use: No    Review of Systems  Genitourinary: Positive for flank pain.   10 Systems reviewed and are negative for acute change except as noted in the HPI.   Allergies  Review of patient's allergies indicates no known allergies.  Home Medications   Prior to Admission medications   Medication Sig Start Date End Date  Taking? Authorizing Provider  omeprazole (PRILOSEC) 20 MG capsule Take 20 mg by mouth daily.   Yes Historical Provider, MD  zolpidem (AMBIEN) 10 MG tablet Take 10 mg by mouth at bedtime as needed for sleep.   Yes Historical Provider, MD  ondansetron (ZOFRAN ODT) 4 MG disintegrating tablet Take 1 tablet (4 mg total) by mouth every 8 (eight) hours as needed for nausea or vomiting. 09/14/15   Sharlett Iles, MD  oxyCODONE-acetaminophen (PERCOCET) 5-325 MG tablet Take 1 tablet by mouth every 6 (six) hours as needed for severe pain. 09/14/15   Sharlett Iles, MD  tamsulosin (FLOMAX) 0.4 MG CAPS capsule Take 1 capsule (0.4 mg total) by mouth daily. 09/14/15   Wenda Overland Little, MD   BP 138/94 mmHg  Pulse 64  Temp(Src) 97.8 F (36.6 C) (Oral)  Resp 16  SpO2 98% Physical Exam  Constitutional: He is oriented to person, place, and time. He appears well-developed and well-nourished. No distress.  Uncomfortable  HENT:  Head: Normocephalic and atraumatic.  Moist mucous membranes  Eyes: Conjunctivae are normal. Pupils are equal, round, and reactive to light.  Neck: Neck supple.  Cardiovascular: Normal rate, regular rhythm and normal heart sounds.   No murmur heard. Pulmonary/Chest: Effort normal and breath sounds normal.  Abdominal: Soft. Bowel sounds are normal. He exhibits no distension. There is no tenderness.  Genitourinary:  No CVA tenderness  Musculoskeletal: He exhibits no edema.  Neurological: He is alert and oriented to person, place, and time.  Fluent speech  Skin: Skin is warm and dry.  Psychiatric: He has a normal mood and affect. Judgment normal.  Nursing note and vitals reviewed.   ED Course  Procedures (including critical care time) Labs Review Labs Reviewed  URINALYSIS, ROUTINE W REFLEX MICROSCOPIC (NOT AT [facility]) - Abnormal; Notable for the following:    APPearance CLOUDY (*)    Hgb urine dipstick LARGE (*)    All other components within normal limits   COMPREHENSIVE METABOLIC PANEL - Abnormal; Notable for the following:    CO2 21 (*)    All other components within normal limits  CBC WITH DIFFERENTIAL/PLATELET - Abnormal; Notable for the following:    MCHC 36.5 (*)    Monocytes Absolute 1.2 (*)    All other components within normal limits  URINE MICROSCOPIC-ADD ON    Imaging Review US Renal  09/14/2015  CLINICAL DATA:  Left flank pain. History of ureteral stones. The petrous cellular carcinoma EXAM: RENAL / URINARY TRACT ULTRASOUND COMPLETE COMPARISON:  One-view abdomen 08/17/2015. CT the abdomen pelvis 07/25/2015. FINDINGS: Right Kidney: Length: 12.8 cm, within normal limits. Echogenicity within normal limits. No mass or hydronephrosis visualized. Left Kidney: Length: 13.1 cm, within normal limits. Echogenicity is within normal limits. Mild left-sided hydronephrosis is now present. No shadowing stones are evident. Bladder: Bladder size is normal. A right ureteral jet is documented. No left ureteral jet is visualized. The patient was unable to empty his bladder. IMPRESSION: 1. New left-sided hydronephrosis suggesting ureteral obstruction. 2. Previously seen nonobstructing left kidney stone is not visualized on today's study. 3. Normal sonographic appearance of the right ureter. 4. Absent left ureteral jet also supports obstruction of the left ureter. Electronically Signed   By: San Morelle M.D.   On: 09/14/2015 09:54   Ct Renal Stone Study  09/14/2015  CLINICAL DATA:  Left hydronephrosis. Left flank pain beginning earlier today. Hematuria. EXAM: CT ABDOMEN AND PELVIS WITHOUT CONTRAST TECHNIQUE: Multidetector CT imaging of the abdomen and pelvis was performed following the standard protocol without IV contrast. COMPARISON:  07/25/2015 CT.  Ultrasound earlier today. FINDINGS: Scarring or atelectasis in the lingula. Lung bases otherwise clear. Heart is normal size. No effusions. Liver, gallbladder, spleen, pancreas and adrenals have an  unremarkable unenhanced appearance. There is mild left hydronephrosis and hydroureter due to 3 mm distal left ureteral stone. No renal or ureteral stones on the right. Small hypodensities in both kidneys likely reflect small cysts. Punctate nonobstructing stones in the mid and lower pole of the left kidney. Appendix is visualized and is normal. Bowel grossly unremarkable. No free fluid, free air, or adenopathy. Urinary bladder and prostate grossly unremarkable. Moderate distal aortic and common iliac artery calcifications. No aneurysm. No acute bony abnormality or focal bone lesion. IMPRESSION: 3 mm distal left ureteral stone with mild left hydronephrosis. Punctate mid-lower pole left nephrolithiasis. Aortoiliac atherosclerosis.  No aneurysm. Electronically Signed   By: Rolm Baptise M.D.   On: 09/14/2015 11:52   I have personally reviewed and evaluated these lab results as part of my medical decision-making.   EKG Interpretation None     Medications  sodium chloride 0.9 % bolus 1,000 mL (0 mLs Intravenous Stopped 09/14/15 1002)  ondansetron (ZOFRAN) injection 4 mg (4 mg Intravenous Given 09/14/15 0906)  HYDROmorphone (DILAUDID) injection 1 mg (1 mg Intravenous Given 09/14/15 0931)  oxyCODONE-acetaminophen (PERCOCET/ROXICET) 5-325 MG per tablet 2 tablet (2 tablets Oral Given 09/14/15 1000)  ketorolac (TORADOL) 30 MG/ML injection 30 mg (30 mg Intravenous Given 09/14/15 1114)  HYDROmorphone (DILAUDID)  injection 1 mg (1 mg Intravenous Given 09/14/15 1118)  HYDROmorphone (DILAUDID) injection 1 mg (1 mg Intravenous Given 09/14/15 1224)    MDM   Final diagnoses:  Left ureteral stone    49 year old male with history of kidney stones who presents with left flank pain that began earlier today and has been associated with nausea. Patient uncomfortable presentation. He was mildly hypertensive but otherwise vital signs stable. No abdominal tenderness on exam. Patient denies any scrotal swelling. Obtained  above labs and gave the patient an IV fluid bolus, Zofran, and Dilaudid for his symptoms. Patient has had multiple CT scans not concerned about his cumulative exposure to radiation, therefore obtained renal US to evaluate for obstruction.  Labs showed hematuria but no evidence of infection, normal creatinine. Ultrasound showed new left-sided hydronephrosis with absent left ureteral jet. Obtained a CT for better characterization to rule out large obstructing stone. CT showed a 3 mm distal left ureteral stone with mild Hydro. On reexamination after receiving the above medications, the patient was more comfortable. Reviewed CT findings and I feel that given the small size of stone and distal location, he is appropriate for trial passage at home. Provided with Percocet, Zofran, and Flomax for use at home and instructions to follow-up with urologist. Return precautions reviewed including fever, worsening symptoms, intractable vomiting. Patient voiced understanding and was discharged in satisfactory condition.  Sharlett Iles, MD 09/14/15 [phone]

## 2015-09-14 NOTE — ED Notes (Addendum)
Pt reports hx of kidney stones, left flank pain starting around 0300 today 10/17. Blood in urine. Reports nausea, denies vomiting. Pt getting agitated, blood around while getting first blood pressure, stating "something wrong with this blood pressure cuff". Denies HTN.

## 2015-09-14 NOTE — ED Notes (Signed)
Pt states pain lower left groin/abdomen; pt states has voided small amount urine before CT scan

## 2015-09-14 NOTE — Discharge Instructions (Signed)
Kidney Stones °Kidney stones (urolithiasis) are deposits that form inside your kidneys. The intense pain is caused by the stone moving through the urinary tract. When the stone moves, the ureter goes into spasm around the stone. The stone is usually passed in the urine.  °CAUSES  °· A disorder that makes certain neck glands produce too much parathyroid hormone (primary hyperparathyroidism). °· A buildup of uric acid crystals, similar to gout in your joints. °· Narrowing (stricture) of the ureter. °· A kidney obstruction present at birth (congenital obstruction). °· Previous surgery on the kidney or ureters. °· Numerous kidney infections. °SYMPTOMS  °· Feeling sick to your stomach (nauseous). °· Throwing up (vomiting). °· Blood in the urine (hematuria). °· Pain that usually spreads (radiates) to the groin. °· Frequency or urgency of urination. °DIAGNOSIS  °· Taking a history and physical exam. °· Blood or urine tests. °· CT scan. °· Occasionally, an examination of the inside of the urinary bladder (cystoscopy) is performed. °TREATMENT  °· Observation. °· Increasing your fluid intake. °· Extracorporeal shock wave lithotripsy--This is a noninvasive procedure that uses shock waves to break up kidney stones. °· Surgery may be needed if you have severe pain or persistent obstruction. There are various surgical procedures. Most of the procedures are performed with the use of small instruments. Only small incisions are needed to accommodate these instruments, so recovery time is minimized. °The size, location, and chemical composition are all important variables that will determine the proper choice of action for you. Talk to your health care provider to better understand your situation so that you will minimize the risk of injury to yourself and your kidney.  °HOME CARE INSTRUCTIONS  °· Drink enough water and fluids to keep your urine clear or pale yellow. This will help you to pass the stone or stone fragments. °· Strain  all urine through the provided strainer. Keep all particulate matter and stones for your health care provider to see. The stone causing the pain may be as small as a grain of salt. It is very important to use the strainer each and every time you pass your urine. The collection of your stone will allow your health care provider to analyze it and verify that a stone has actually passed. The stone analysis will often identify what you can do to reduce the incidence of recurrences. °· Only take over-the-counter or prescription medicines for pain, discomfort, or fever as directed by your health care provider. °· Keep all follow-up visits as told by your health care provider. This is important. °· Get follow-up X-rays if required. The absence of pain does not always mean that the stone has passed. It may have only stopped moving. If the urine remains completely obstructed, it can cause loss of kidney function or even complete destruction of the kidney. It is your responsibility to make sure X-rays and follow-ups are completed. Ultrasounds of the kidney can show blockages and the status of the kidney. Ultrasounds are not associated with any radiation and can be performed easily in a matter of minutes. °· Make changes to your daily diet as told by your health care provider. You may be told to: °¨ Limit the amount of salt that you eat. °¨ Eat 5 or more servings of fruits and vegetables each day. °¨ Limit the amount of meat, poultry, fish, and eggs that you eat. °· Collect a 24-hour urine sample as told by your health care provider. You may need to collect another urine sample every 6-12   months. °SEEK MEDICAL CARE IF: °· You experience pain that is progressive and unresponsive to any pain medicine you have been prescribed. °SEEK IMMEDIATE MEDICAL CARE IF:  °· Pain cannot be controlled with the prescribed medicine. °· You have a fever or shaking chills. °· The severity or intensity of pain increases over 18 hours and is not  relieved by pain medicine. °· You develop a new onset of abdominal pain. °· You feel faint or pass out. °· You are unable to urinate. °  °This information is not intended to replace advice given to you by your health care provider. Make sure you discuss any questions you have with your health care provider. °  °Document Released: 11/14/2005 Document Revised: 08/05/2015 Document Reviewed: 04/17/2013 °Elsevier Interactive Patient Education ©2016 [employer] ° °

## 2015-09-14 NOTE — ED Notes (Signed)
Pt c/o pain and requesting more pain meds.  Made Dr Rex Kras aware.

## 2015-09-15 ENCOUNTER — Other Ambulatory Visit: Payer: Self-pay | Admitting: Urology

## 2015-09-16 ENCOUNTER — Encounter (HOSPITAL_COMMUNITY): Payer: Self-pay | Admitting: *Deleted

## 2015-09-16 NOTE — Anesthesia Preprocedure Evaluation (Addendum)
Anesthesia Evaluation  Patient identified by MRN, date of birth, ID band Patient awake    Reviewed: Allergy & Precautions, H&P , NPO status , Patient's Chart, lab work & pertinent test results  Airway Mallampati: II  TM Distance: >3 FB Neck ROM: Full    Dental no notable dental hx. (+) Teeth Intact, Dental Advisory Given, Missing   Pulmonary Current Smoker (2 packs/day),    Pulmonary exam normal breath sounds clear to auscultation       Cardiovascular negative cardio ROS Normal cardiovascular exam Rhythm:Regular Rate:Normal     Neuro/Psych negative neurological ROS  negative psych ROS   GI/Hepatic negative GI ROS, Neg liver ROS,   Endo/Other  negative endocrine ROS  Renal/GU Renal disease     Musculoskeletal negative musculoskeletal ROS (+) Spinal stenosis   Abdominal (+)  Abdomen: soft.    Peds  Hematology negative hematology ROS (+) 15/42   Anesthesia Other Findings   Reproductive/Obstetrics                            Anesthesia Physical  Anesthesia Plan  ASA: II  Anesthesia Plan: General   Post-op Pain Management:    Induction: Intravenous  Airway Management Planned: LMA  Additional Equipment:   Intra-op Plan:   Post-operative Plan: Extubation in OR  Informed Consent: I have reviewed the patients History and Physical, chart, labs and discussed the procedure including the risks, benefits and alternatives for the proposed anesthesia with the patient or authorized representative who has indicated his/her understanding and acceptance.   Dental advisory given  Plan Discussed with: CRNA  Anesthesia Plan Comments:         Anesthesia Quick Evaluation

## 2015-09-17 NOTE — H&P (Signed)
Reason For Visit Seen today for an ER f/u.   Active Problems Problems  1. Asymptomatic microscopic hematuria (R31.21)   Assessed By: Jimmey Ralph (Urology); Last Assessed: 15 Sep 2015 2. Hydronephrosis, left (N13.30)   Assessed By: Jimmey Ralph (Urology); Last Assessed: 15 Sep 2015 3. Left ureteral stone (N20.1)   Assessed By: Jimmey Ralph (Urology); Last Assessed: 15 Sep 2015  History of Present Illness 49 YO male patient of Dr. Lyndal Rainbow seen today for an ER f/u. Seen in the ER 09/14/15 for abdominal pain and hematuria. CT urogram mild left hydronephrosis secondary to distal 3 mm left ureteral stone.    GU hx:   07/28/2015: Tristan Wood was seen in the ED on 08/27 for left flank pain. CT imaging revealed a non-obstructing distal left ureteral lithiasis. Pain control was achieved and he was discharged and told to f/u here.   He endorses a history of 12 kidney stones. He is currently managing his left lower quadrant abdominal pain with oxycodone. He is also taking tamsulosin and zofran. He denies LUTS including gross hematuria or dysuria. He has been afebrile. He denies passing a stone.     Interval Hx:   Today states he has had intermittent pain, nausea, and hematuria since trying to pass stone since Aug. "I just have to have something done. I can't keep missing work!" Denies n/v or f/c today.   Past Medical History Problems  1. History of esophageal reflux (Z87.19) 2. History of kidney stones (Z87.442) 3. History of kidney stones (Z87.442) 4. History of Nephrolithiasis Of The Left Kidney 5. History of Skin Cancer  Surgical History Problems  1. History of Biopsy Skin 2. History of Cystoscopy With Ureteroscopy With Lithotripsy 3. History of Renal Lithotripsy 4. History of Therapeutic Cystoscopy  Current Meds 1. Omeprazole 20 MG Oral Capsule Delayed Release;  Therapy: [id number] to Recorded 2. Ondansetron 8 MG Oral Tablet Dispersible; TAKE 8 MG Every 8 hours PRN;  Last  Rx:02Sep2016 Ordered 3. Tamsulosin HCl - 0.4 MG Oral Capsule; TAKE 1 CAPSULE Daily; Last Rx:02Sep2016  Ordered 4. Zolpidem Tartrate TABS;  Therapy: (Recorded:05Nov2014) to Recorded  Allergies Medication  1. No Known Drug Allergies  Family History Problems  1. Family history of Family Health Status Number Of Children   1 daughter 2. Family history of Kidney Cancer : Mother 3. Family history of Nephrolithiasis 4. Family history of Renal Failure  Social History Problems  1. Being A Social Drinker 2. Caffeine Use   4 per day 3. Current every day smoker (F17.200) 4. Marital History - Currently Married 5. Occupation:   [employer] 6. Tobacco Use   1.5 ppd for 15yrs  Review of Systems Genitourinary, constitutional, skin, eye, otolaryngeal, hematologic/lymphatic, cardiovascular, pulmonary, endocrine, musculoskeletal, gastrointestinal, neurological and psychiatric system(s) were reviewed and pertinent findings if present are noted and are otherwise negative.  Genitourinary: hematuria.  Gastrointestinal: abdominal pain.    Vitals Vital Signs [Data Includes: Last 1 Day]  Recorded: 18Oct2016 02:18PM  Blood Pressure: 118 / 76 Temperature: 98.6 F Heart Rate: 65  Physical Exam Constitutional: Well nourished and well developed . No acute distress. The patient appears well hydrated.  Abdomen: The abdomen is mildly obese. The abdomen is soft and nontender. No tenderness in the RLQ and no LLQ tenderness. No CVA tenderness.  Skin: Normal skin turgor and normal skin color and pigmentation.  Neuro/Psych:. Mood and affect are appropriate.    Results/Data Urine [Data Includes: Last 1 Day]   [id number]  COLOR YELLOW   APPEARANCE  CLEAR   SPECIFIC GRAVITY 1.025   pH 6.0   GLUCOSE NEGATIVE   BILIRUBIN NEGATIVE   KETONE NEGATIVE   BLOOD 2+   PROTEIN NEGATIVE   NITRITE NEGATIVE   LEUKOCYTE ESTERASE NEGATIVE   SQUAMOUS EPITHELIAL/HPF 0-5 HPF  WBC 0-5 WBC/HPF  RBC 3-10  RBC/HPF  BACTERIA NONE SEEN HPF  CRYSTALS NONE SEEN HPF  CASTS NONE SEEN LPF  Yeast NONE SEEN HPF   Old records or history reviewed: Reviewed ER notes.  The following clinical lab reports were reviewed:  UA: microscopic hematuria.  The following radiology reports were reviewed: Reviewed CT urogram which shows distal left ureteral stone.    Assessment Assessed  1. Left ureteral stone (N20.1) 2. Hydronephrosis, left (N13.30) 3. Asymptomatic microscopic hematuria (R31.21)  Plan Health Maintenance  1. UA With REFLEX; [Do Not Release]; Status:Complete;   Done: [id number] 02:10PM  Will given pt out of work note from 10/18-25/16 with goal to proceed with elective cystourethroscopy, L RPG, stone extraction, and possible double J stent by Dr. Junious Silk 09/18/15. Risks and benefits discussed with pt and he does not want ESWL. Voices understanding and wishes to proceed.   Signatures Electronically signed by : Jimmey Ralph, Tristan Wood; Sep 15 2015  2:50PM EST

## 2015-09-18 ENCOUNTER — Ambulatory Visit (HOSPITAL_COMMUNITY): Payer: Commercial Managed Care - HMO | Admitting: Anesthesiology

## 2015-09-18 ENCOUNTER — Ambulatory Visit (HOSPITAL_COMMUNITY)
Admission: RE | Admit: 2015-09-18 | Discharge: 2015-09-18 | Disposition: A | Discharge status: Home / Self Care | Payer: Commercial Managed Care - HMO | Source: Ambulatory Visit | Attending: Urology | Admitting: Urology

## 2015-09-18 ENCOUNTER — Ambulatory Visit (HOSPITAL_COMMUNITY): Payer: Commercial Managed Care - HMO

## 2015-09-18 ENCOUNTER — Encounter (HOSPITAL_COMMUNITY)
Admission: RE | Disposition: A | Discharge status: Home / Self Care | Payer: Self-pay | Source: Ambulatory Visit | Attending: Urology

## 2015-09-18 ENCOUNTER — Encounter (HOSPITAL_COMMUNITY): Payer: Self-pay | Admitting: *Deleted

## 2015-09-18 DIAGNOSIS — F17200 Nicotine dependence, unspecified, uncomplicated: Secondary | ICD-10-CM | POA: Diagnosis not present

## 2015-09-18 DIAGNOSIS — Z87442 Personal history of urinary calculi: Secondary | ICD-10-CM | POA: Diagnosis not present

## 2015-09-18 DIAGNOSIS — N200 Calculus of kidney: Secondary | ICD-10-CM

## 2015-09-18 DIAGNOSIS — N132 Hydronephrosis with renal and ureteral calculous obstruction: Secondary | ICD-10-CM | POA: Insufficient documentation

## 2015-09-18 DIAGNOSIS — M4800 Spinal stenosis, site unspecified: Secondary | ICD-10-CM | POA: Insufficient documentation

## 2015-09-18 DIAGNOSIS — Z79899 Other long term (current) drug therapy: Secondary | ICD-10-CM | POA: Insufficient documentation

## 2015-09-18 DIAGNOSIS — K219 Gastro-esophageal reflux disease without esophagitis: Secondary | ICD-10-CM | POA: Insufficient documentation

## 2015-09-18 HISTORY — DX: Gastro-esophageal reflux disease without esophagitis: K21.9

## 2015-09-18 HISTORY — PX: CYSTOSCOPY WITH RETROGRADE PYELOGRAM, URETEROSCOPY AND STENT PLACEMENT: SHX5789

## 2015-09-18 SURGERY — CYSTOURETEROSCOPY, WITH RETROGRADE PYELOGRAM AND STENT INSERTION
Anesthesia: General | Laterality: Left

## 2015-09-18 MED ORDER — MIDAZOLAM HCL 2 MG/2ML IJ SOLN
INTRAMUSCULAR | Status: AC
  Filled 2015-09-18: qty 4

## 2015-09-18 MED ORDER — FENTANYL CITRATE (PF) 100 MCG/2ML IJ SOLN
INTRAMUSCULAR | Status: AC
  Filled 2015-09-18: qty 4

## 2015-09-18 MED ORDER — LIDOCAINE HCL (CARDIAC) 20 MG/ML IV SOLN
INTRAVENOUS | Status: DC | PRN
  Administered 2015-09-18: 100 mg via INTRAVENOUS

## 2015-09-18 MED ORDER — MEPERIDINE HCL 50 MG/ML IJ SOLN: 6.2500 mg | INTRAMUSCULAR | Status: DC | PRN

## 2015-09-18 MED ORDER — BELLADONNA ALKALOIDS-OPIUM 16.2-60 MG RE SUPP
RECTAL | Status: DC | PRN
  Administered 2015-09-18: 1 via RECTAL

## 2015-09-18 MED ORDER — LIDOCAINE HCL (CARDIAC) 20 MG/ML IV SOLN
INTRAVENOUS | Status: AC
  Filled 2015-09-18: qty 5

## 2015-09-18 MED ORDER — PROPOFOL 10 MG/ML IV BOLUS
INTRAVENOUS | Status: AC
  Filled 2015-09-18: qty 20

## 2015-09-18 MED ORDER — MIDAZOLAM HCL 5 MG/5ML IJ SOLN
INTRAMUSCULAR | Status: DC | PRN
  Administered 2015-09-18: 2 mg via INTRAVENOUS

## 2015-09-18 MED ORDER — PROMETHAZINE HCL 25 MG/ML IJ SOLN: 6.2500 mg | INTRAMUSCULAR | Status: DC | PRN

## 2015-09-18 MED ORDER — FENTANYL CITRATE (PF) 100 MCG/2ML IJ SOLN
INTRAMUSCULAR | Status: DC | PRN
  Administered 2015-09-18: 100 ug via INTRAVENOUS

## 2015-09-18 MED ORDER — CEFAZOLIN SODIUM-DEXTROSE 2-3 GM-% IV SOLR
2.0000 g | INTRAVENOUS | Status: AC
  Administered 2015-09-18: 2 g via INTRAVENOUS

## 2015-09-18 MED ORDER — FENTANYL CITRATE (PF) 100 MCG/2ML IJ SOLN: 25.0000 ug | INTRAMUSCULAR | Status: DC | PRN

## 2015-09-18 MED ORDER — CEFAZOLIN SODIUM-DEXTROSE 2-3 GM-% IV SOLR
INTRAVENOUS | Status: AC
  Filled 2015-09-18: qty 50

## 2015-09-18 MED ORDER — ONDANSETRON HCL 4 MG/2ML IJ SOLN
INTRAMUSCULAR | Status: DC | PRN
  Administered 2015-09-18: 4 mg via INTRAVENOUS

## 2015-09-18 MED ORDER — LACTATED RINGERS IV SOLN
INTRAVENOUS | Status: DC | PRN
  Administered 2015-09-18 (×2): via INTRAVENOUS

## 2015-09-18 MED ORDER — KETOROLAC TROMETHAMINE 30 MG/ML IJ SOLN
INTRAMUSCULAR | Status: DC | PRN
  Administered 2015-09-18: 30 mg via INTRAVENOUS

## 2015-09-18 MED ORDER — PROPOFOL 10 MG/ML IV BOLUS
INTRAVENOUS | Status: DC | PRN
  Administered 2015-09-18: 200 mg via INTRAVENOUS

## 2015-09-18 MED ORDER — DEXAMETHASONE SODIUM PHOSPHATE 10 MG/ML IJ SOLN
INTRAMUSCULAR | Status: DC | PRN
  Administered 2015-09-18: 10 mg via INTRAVENOUS

## 2015-09-18 MED ORDER — BELLADONNA ALKALOIDS-OPIUM 16.2-60 MG RE SUPP
RECTAL | Status: AC
  Filled 2015-09-18: qty 1

## 2015-09-18 SURGICAL SUPPLY — 26 items
BAG URO CATCHER STRL LF (DRAPE) ×3 IMPLANT
BASKET LASER NITINOL 1.9FR (BASKET) IMPLANT
BASKET STNLS GEMINI 4WIRE 3FR (BASKET) IMPLANT
BASKET ZERO TIP NITINOL 2.4FR (BASKET) IMPLANT
BRUSH URET BIOPSY 3F (UROLOGICAL SUPPLIES) IMPLANT
BSKT STON RTRVL 120 1.9FR (BASKET)
BSKT STON RTRVL GEM 120X11 3FR (BASKET)
BSKT STON RTRVL ZERO TP 2.4FR (BASKET)
CATH INTERMIT  6FR 70CM (CATHETERS) IMPLANT
CLOTH BEACON ORANGE TIMEOUT ST (SAFETY) ×3 IMPLANT
FIBER LASER FLEXIVA 200 (UROLOGICAL SUPPLIES) IMPLANT
FIBER LASER FLEXIVA 365 (UROLOGICAL SUPPLIES) IMPLANT
FIBER LASER TRAC TIP (UROLOGICAL SUPPLIES) IMPLANT
GLOVE BIOGEL M STRL SZ7.5 (GLOVE) ×3 IMPLANT
GOWN STRL REUS W/TWL XL LVL3 (GOWN DISPOSABLE) ×3 IMPLANT
GUIDEWIRE ANG ZIPWIRE 038X150 (WIRE) IMPLANT
GUIDEWIRE STR DUAL SENSOR (WIRE) ×2 IMPLANT
IV NS IRRIG 3000ML ARTHROMATIC (IV SOLUTION) ×6 IMPLANT
KIT BALLIN UROMAX 15FX10 (LABEL) IMPLANT
KIT BALLN UROMAX 15FX4 (MISCELLANEOUS) IMPLANT
KIT BALLN UROMAX 26 75X4 (MISCELLANEOUS)
PACK CYSTO (CUSTOM PROCEDURE TRAY) ×4 IMPLANT
SET HIGH PRES BAL DIL (LABEL)
SHEATH ACCESS URETERAL 24CM (SHEATH) IMPLANT
SHEATH ACCESS URETERAL 54CM (SHEATH) IMPLANT
SYRINGE IRR TOOMEY STRL 70CC (SYRINGE) IMPLANT

## 2015-09-18 NOTE — Op Note (Signed)
Preoperative diagnosis: Left ureteral stone Postoperative diagnosis: Normal ureter  Procedure: Cystoscopy, left retropyelogram, left diagnostic ureteroscopy  Surgeon: Junious Silk  Anesthesia: Gen.   indication for procedure: 49 year old with symptomatic left distal ureteral stone. He was seen in the office and KUB revealed no stone and it was thought it had passed. He then developed recurrent pain and underwent repeat CT scan which revealed the stone was still present. He presented today for left ureteroscopy and had some pain early this morning and thought he passed a small clot but continued to have left lower quadrant pain. I repeated the KUB but again it did not show a stones a was not very helpful. I discussed with the patient and he wanted to be certain there was no stone and elected to proceed with ureteroscopy today.  Findings On exam under anesthesia/digital rectal exam the prostate was smooth without hard areas or nodules. Minimal enlargement. I placed a B&O suppository.  Left retrograde pyelogram-this outlined a single ureter single collecting system unit. There is no dilation, hydronephrosis or stricture. I thought there might have been a small filling defect near the ureterovesical junction as can be difficult to tell in these cases with small stones. There were no other filling defects. I thought it was best look with the ureteroscope.  On ureteroscopy no stone was noted and I was able to advance the scope well up toward the proximal ureter.  Description of procedure: After consent was obtained patient brought to the operating room. After adequate anesthesia he is placed in lithotomy position and prepped and draped in the usual sterile fashion. A timeout was performed to confirm the patient and procedure. An exam under anesthesia was performed and a B&O suppository placed. The cystoscope was passed per urethra and the 6 [place] open-ended catheter used to cannulate left ureteral orifice  and left retrograde injection of contrast was performed. There was brisk drainage.  Then advanced a 4.5 French semirigid ureteroscope without difficulty into the distal ureter. I was able to scope up into the proximal ureter and no stone was noted. There was minimal trauma from the scope. The ureter appeared normal. I did not leave a stent. The bladder was drained and the scope removed. The patient was awakened and taken to recovery room in stable condition.  Complications: None  Blood loss: Minimal  Specimens: None  Drains: None  Disposition: Patient stable to PACU

## 2015-09-18 NOTE — Progress Notes (Signed)
Patient suspects he may have passed his kidney stone.  Order received to obtain a KUB. No stone seen by radiologist. Talked with Dr Junious Silk. Dr Junious Silk

## 2015-09-18 NOTE — Anesthesia Postprocedure Evaluation (Signed)
  Anesthesia Post-op Note  Patient: Tristan Wood  Procedure(s) Performed: Procedure(s): CYSTOSCOPY WITH LEFT URETEROSCOPY LEFT RETROGRADE PYELOGRAM  (Left)  Patient Location: PACU  Anesthesia Type:General  Level of Consciousness: awake and alert   Airway and Oxygen Therapy: Patient Spontanous Breathing and Patient connected to nasal cannula oxygen  Post-op Pain: mild  Post-op Assessment: Post-op Vital signs reviewed and Patient's Cardiovascular Status Stable              Post-op Vital Signs: Reviewed and stable  Last Vitals:  Filed Vitals:   09/18/15 1354  BP:   Pulse: 57  Temp:   Resp: 13    Complications: No apparent anesthesia complications

## 2015-09-18 NOTE — Discharge Instructions (Signed)
Cystoscopy, Care After °Refer to this sheet in the next few weeks. These instructions provide you with information on caring for yourself after your procedure. Your caregiver may also give you more specific instructions. Your treatment has been planned according to current medical practices, but problems sometimes occur. Call your caregiver if you have any problems or questions after your procedure. °HOME CARE INSTRUCTIONS  °Things you can do to ease any discomfort after your procedure include: °· Drinking enough water and fluids to keep your urine clear or pale yellow. °· Taking a warm bath to relieve any burning feelings. °SEEK IMMEDIATE MEDICAL CARE IF:  °· You have an increase in blood in your urine. °· You notice blood clots in your urine. °· You have difficulty passing urine. °· You have the chills. °· You have abdominal pain. °· You have a fever or persistent symptoms for more than 2-3 days. °· You have a fever and your symptoms suddenly get worse. °MAKE SURE YOU:  °· Understand these instructions. °· Will watch your condition. °· Will get help right away if you are not doing well or get worse. °  °This information is not intended to replace advice given to you by your health care provider. Make sure you discuss any questions you have with your health care provider. °  °Document Released: 06/03/2005 Document Revised: 12/05/2014 Document Reviewed: 05/07/2012 °Elsevier Interactive Patient Education ©2016 [employer] °General Anesthesia, Adult, Care After °Refer to this sheet in the next few weeks. These instructions provide you with information on caring for yourself after your procedure. Your health care provider may also give you more specific instructions. Your treatment has been planned according to current medical practices, but problems sometimes occur. Call your health care provider if you have any problems or questions after your procedure. °WHAT TO EXPECT AFTER THE PROCEDURE °After the procedure, it  is typical to experience: °· Sleepiness. °· Nausea and vomiting. °HOME CARE INSTRUCTIONS °· For the first 24 hours after general anesthesia: °¨ Have a responsible person with you. °¨ Do not drive a car. If you are alone, do not take public transportation. °¨ Do not drink alcohol. °¨ Do not take medicine that has not been prescribed by your health care provider. °¨ Do not sign important papers or make important decisions. °¨ You may resume a normal diet and activities as directed by your health care provider. °· Change bandages (dressings) as directed. °· If you have questions or problems that seem related to general anesthesia, call the hospital and ask for the anesthetist or anesthesiologist on call. °SEEK MEDICAL CARE IF: °· You have nausea and vomiting that continue the day after anesthesia. °· You develop a rash. °SEEK IMMEDIATE MEDICAL CARE IF:  °· You have difficulty breathing. °· You have chest pain. °· You have any allergic problems. °  °This information is not intended to replace advice given to you by your health care provider. Make sure you discuss any questions you have with your health care provider. °  °Document Released: 02/20/2001 Document Revised: 12/05/2014 Document Reviewed: 03/14/2012 °Elsevier Interactive Patient Education ©2016 [employer]. ° °

## 2015-09-18 NOTE — Transfer of Care (Signed)
Immediate Anesthesia Transfer of Care Note  Patient: Tristan Wood  Procedure(s) Performed: Procedure(s): CYSTOSCOPY WITH LEFT URETEROSCOPY LEFT RETROGRADE PYELOGRAM  (Left)  Patient Location: PACU  Anesthesia Type:General  Level of Consciousness: sedated  Airway & Oxygen Therapy: Patient Spontanous Breathing and Patient connected to face mask oxygen  Post-op Assessment: Report given to RN and Post -op Vital signs reviewed and stable  Post vital signs: Reviewed and stable  Last Vitals:  Filed Vitals:   09/18/15 1022  BP: 127/88  Pulse: 74  Temp: 36.4 C  Resp: 18    Complications: No apparent anesthesia complications

## 2015-09-18 NOTE — Interval H&P Note (Signed)
History and Physical Interval Note:  09/18/2015 11:46 AM  Tristan Wood  has presented today for surgery, with the diagnosis of LEFT DISTAL URETERAL STONE   The various methods of treatment have been discussed with the patient and family. After consideration of risks, benefits and other options for treatment, the patient has consented to  Procedure(s): CYSTOSCOPY WITH LEFT URETEROSCOPY LEFT RETROGRADE PYELOGRAM, STONE EXTRACTION AND POSSIBLE DOUBLE J STENT PLACEMENT (Left) WITH HOLMIUM LASER  (Left) as a surgical intervention .  We discussed continued surveillance with f/u in office for left renal u/s and/or other imaging. The patient's history has been reviewed, patient examined, no change in status, stable for surgery. He had pain this AM at 3 AM but did not see the stone pass. Kub today is equivocal but it was also in the office. He was scanned again 10/17 and the stone was still there. I have reviewed the patient's chart and labs.  Questions were answered to the patient's satisfaction.  He elects to proceed. He feels like he is still having some discomfort today. There was no other stones.    Tristan Wood

## 2016-09-29 ENCOUNTER — Other Ambulatory Visit: Payer: Self-pay | Admitting: Emergency Medicine

## 2016-09-29 ENCOUNTER — Ambulatory Visit
Admission: RE | Admit: 2016-09-29 | Discharge: 2016-09-29 | Disposition: A | Discharge status: Home / Self Care | Payer: Worker's Compensation | Source: Ambulatory Visit | Attending: Emergency Medicine | Admitting: Emergency Medicine

## 2016-09-29 DIAGNOSIS — M25571 Pain in right ankle and joints of right foot: Secondary | ICD-10-CM

## 2016-11-19 ENCOUNTER — Encounter (HOSPITAL_BASED_OUTPATIENT_CLINIC_OR_DEPARTMENT_OTHER): Payer: Self-pay | Admitting: Emergency Medicine

## 2016-11-19 ENCOUNTER — Emergency Department (HOSPITAL_BASED_OUTPATIENT_CLINIC_OR_DEPARTMENT_OTHER)
Admission: EM | Admit: 2016-11-19 | Discharge: 2016-11-19 | Disposition: A | Discharge status: Home / Self Care | Payer: Commercial Managed Care - HMO | Attending: Emergency Medicine | Admitting: Emergency Medicine

## 2016-11-19 DIAGNOSIS — M5442 Lumbago with sciatica, left side: Secondary | ICD-10-CM | POA: Insufficient documentation

## 2016-11-19 DIAGNOSIS — F17210 Nicotine dependence, cigarettes, uncomplicated: Secondary | ICD-10-CM | POA: Diagnosis not present

## 2016-11-19 DIAGNOSIS — M545 Low back pain: Secondary | ICD-10-CM | POA: Diagnosis present

## 2016-11-19 HISTORY — DX: Pure hypercholesterolemia, unspecified: E78.00

## 2016-11-19 MED ORDER — PREDNISONE 10 MG (21) PO TBPK: 10.0000 mg | ORAL_TABLET | Freq: Every day | ORAL | 0 refills | Status: DC

## 2016-11-19 MED ORDER — METHOCARBAMOL 500 MG PO TABS: 500.0000 mg | ORAL_TABLET | Freq: Two times a day (BID) | ORAL | 0 refills | Status: DC

## 2016-11-19 MED ORDER — LIDOCAINE 5 % EX PTCH: 1.0000 | MEDICATED_PATCH | CUTANEOUS | 0 refills | Status: DC

## 2016-11-19 MED ORDER — HYDROCODONE-ACETAMINOPHEN 5-325 MG PO TABS: 1.0000 | ORAL_TABLET | Freq: Four times a day (QID) | ORAL | 0 refills | Status: DC | PRN

## 2016-11-19 MED ORDER — KETOROLAC TROMETHAMINE 60 MG/2ML IM SOLN
60.0000 mg | Freq: Once | INTRAMUSCULAR | Status: AC
  Administered 2016-11-19: 60 mg via INTRAMUSCULAR
  Filled 2016-11-19: qty 2

## 2016-11-19 MED ORDER — METHOCARBAMOL 500 MG PO TABS
1000.0000 mg | ORAL_TABLET | Freq: Once | ORAL | Status: AC
  Administered 2016-11-19: 1000 mg via ORAL
  Filled 2016-11-19: qty 2

## 2016-11-19 MED ORDER — PREDNISONE 50 MG PO TABS
60.0000 mg | ORAL_TABLET | Freq: Once | ORAL | Status: AC
  Administered 2016-11-19: 60 mg via ORAL
  Filled 2016-11-19: qty 1

## 2016-11-19 MED ORDER — OXYCODONE-ACETAMINOPHEN 5-325 MG PO TABS
1.0000 | ORAL_TABLET | Freq: Once | ORAL | Status: AC
  Administered 2016-11-19: 1 via ORAL
  Filled 2016-11-19: qty 1

## 2016-11-19 NOTE — ED Triage Notes (Signed)
L lower back pain x 1 week radiating down L leg with hx of chronic pain pain. Pt has appt with surgeon next week but states pain is unbearable.

## 2016-11-19 NOTE — ED Notes (Signed)
Pt states he has had a bad back for a couple of years. Wednesday was just standing and back "went out on him". Putting off surgery, but has appt with Ortho surgeon on the 26th. Pain is radiating around left knee. Moves toes. Feels touch. Cap refill < 3 sec. +dpp palp.

## 2016-11-19 NOTE — ED Provider Notes (Signed)
[facility] DEPT Provider Note   CSN: [id number] Arrival date & time: 11/19/16  1241 By signing my name below, I, Dyke Brackett, attest that this documentation has been prepared under the direction and in the presence of non-physician practitioner, Arlean Hopping, PA-C. Electronically Signed: Dyke Brackett, Scribe. 11/19/2016. 4:33 PM.   History   Chief Complaint Chief Complaint  Patient presents with  . Back Pain   HPI Tristan Wood is a 50 y.o. male, with a hx of spinal stenosis, who presents to the Emergency Department complaining of acute on chronic, constant, left sided back pain that radiates to left knee recurring three days ago. He rates his pain as 10/10 while standing and describes it as shooting in quality. Pain is exacerbated by standing and alleviating by sitting forward. He has taken Ibuprofen 600 mg and tramadol with no relief of pain. No hx of back surgery. He is currently followed by Melina Schools at [facility] and has an appointment at his office on 11/22/16. He denies any recent fall, injury or trauma. Pt denies neuro deficits, fever/chills, changes in bowel or bladder function, or any other complaints.     The history is provided by the patient. No language interpreter was used.   Past Medical History:  Diagnosis Date  . Basal cell carcinoma   . Cancer ([place])   . GERD (gastroesophageal reflux disease)   . High cholesterol   . Kidney stone   . Kidney stones   . Spinal stenosis     There are no active problems to display for this patient.   Past Surgical History:  Procedure Laterality Date  . CYSTOSCOPY WITH RETROGRADE PYELOGRAM, URETEROSCOPY AND STENT PLACEMENT Right 08/29/2014   Procedure: CYSTOSCOPY WITH RETROGRADE PYELOGRAM, URETEROSCOPY, STONE REMOVAL;  Surgeon: Festus Aloe, MD;  Location: [facility];  Service: Urology;  Laterality: Right;  . CYSTOSCOPY WITH RETROGRADE PYELOGRAM, URETEROSCOPY AND STENT PLACEMENT Left  09/18/2015   Procedure: CYSTOSCOPY WITH LEFT URETEROSCOPY LEFT RETROGRADE PYELOGRAM ;  Surgeon: Festus Aloe, MD;  Location: [facility];  Service: Urology;  Laterality: Left;  . MOHS SURGERY       Home Medications    Prior to Admission medications   Medication Sig Start Date End Date Taking? Authorizing Provider  atorvastatin (LIPITOR) 10 MG tablet Take 10 mg by mouth daily.   Yes Historical Provider, MD  omeprazole (PRILOSEC) 20 MG capsule Take 20 mg by mouth daily.   Yes Historical Provider, MD  HYDROcodone-acetaminophen (NORCO/VICODIN) 5-325 MG tablet Take 1 tablet by mouth every 6 (six) hours as needed. 11/19/16   Everlie Eble C Jeslynn Hollander, PA-C  lidocaine (LIDODERM) 5 % Place 1 patch onto the skin daily. Remove & Discard patch within 12 hours or as directed by MD 11/19/16   Lorayne Bender, PA-C  methocarbamol (ROBAXIN) 500 MG tablet Take 1 tablet (500 mg total) by mouth 2 (two) times daily. 11/19/16   Sagrario Lineberry C Faythe Heitzenrater, PA-C  ondansetron (ZOFRAN ODT) 4 MG disintegrating tablet Take 1 tablet (4 mg total) by mouth every 8 (eight) hours as needed for nausea or vomiting. 09/14/15   Sharlett Iles, MD  oxyCODONE-acetaminophen (PERCOCET) 5-325 MG tablet Take 1 tablet by mouth every 6 (six) hours as needed for severe pain. 09/14/15   Sharlett Iles, MD  predniSONE (STERAPRED UNI-PAK 21 TAB) 10 MG (21) TBPK tablet Take 1 tablet (10 mg total) by mouth daily. Take 6 tabs day 1, 5 tabs day 2, 4 tabs day 3, 3 tabs day 4,  2 tabs day 5, and 1 tab on day 6. 11/19/16   Tres Grzywacz C Liller Yohn, PA-C  tamsulosin (FLOMAX) 0.4 MG CAPS capsule Take 1 capsule (0.4 mg total) by mouth daily. 09/14/15   Sharlett Iles, MD  zolpidem (AMBIEN) 10 MG tablet Take 10 mg by mouth at bedtime as needed for sleep.    Historical Provider, MD    Family History No family history on file.  Social History Social History  Substance Use Topics  . Smoking status: Current Every Day Smoker    Packs/day: 2.00    Years: 30.00    Types:  Cigarettes  . Smokeless tobacco: Never Used  . Alcohol use No     Allergies   Patient has no known allergies.   Review of Systems Review of Systems  Constitutional: Negative for fever.  Genitourinary: Negative for difficulty urinating.  Musculoskeletal: Positive for back pain.  Neurological: Negative for weakness and numbness.   Physical Exam Updated Vital Signs BP 117/71   Pulse 68   Temp 97.9 F (36.6 C) (Oral)   Resp 18   Ht 6' (1.829 m)   Wt 230 lb (104.3 kg)   SpO2 98%   BMI 31.19 kg/m   Physical Exam  Constitutional: He appears well-developed and well-nourished. No distress.  HENT:  Head: Normocephalic and atraumatic.  Eyes: Conjunctivae are normal.  Neck: Neck supple.  Cardiovascular: Normal rate, regular rhythm and intact distal pulses.   Pulmonary/Chest: Effort normal.  Musculoskeletal: He exhibits tenderness.  Tenderness to left buttock in the sciatic nerve distribution. Normal motor function intact in all extremities and spine. No midline spinal tenderness.   Neurological: He is alert.  No sensory deficits. Strength 5/5 in all extremities. No gait disturbance. Coordination intact.   Skin: Skin is warm and dry. He is not diaphoretic.  Psychiatric: He has a normal mood and affect. His behavior is normal.  Nursing note and vitals reviewed.   ED Treatments / Results  DIAGNOSTIC STUDIES:  Oxygen Saturation is 98% on RA, normal by my interpretation.    COORDINATION OF CARE:  4:08 PM Discussed treatment plan with pt at bedside and pt agreed to plan.  Labs (all labs ordered are listed, but only abnormal results are displayed) Labs Reviewed - No data to display  EKG  EKG Interpretation None       Radiology No results found.  Procedures Procedures (including critical care time)  Medications Ordered in ED Medications  ketorolac (TORADOL) injection 60 mg (60 mg Intramuscular Given 11/19/16 1630)  methocarbamol (ROBAXIN) tablet 1,000 mg (1,000  mg Oral Given 11/19/16 1629)  predniSONE (DELTASONE) tablet 60 mg (60 mg Oral Given 11/19/16 1629)  oxyCODONE-acetaminophen (PERCOCET/ROXICET) 5-325 MG per tablet 1 tablet (1 tablet Oral Given 11/19/16 1629)     Initial Impression / Assessment and Plan / ED Course  I have reviewed the triage vital signs and the nursing notes.  Pertinent labs & imaging results that were available during my care of the patient were reviewed by me and considered in my medical decision making (see chart for details).  Clinical Course    Patient presents with acute on chronic back pain and sciatica distribution. No neuro or functional deficits. No red flag symptoms. The patient was given instructions for home care as well as return precautions. Patient voices understanding of these instructions, accepts the plan, and is comfortable with discharge.    Vitals:   11/19/16 1249 11/19/16 1250 11/19/16 1543 11/19/16 1733  BP: 135/95  117/71  137/96  Pulse: 76  68 65  Resp: 18  18 16   Temp: 97.9 F (36.6 C)     TempSrc: Oral     SpO2: 99%  98% 96%  Weight:  104.3 kg    Height:  6' (1.829 m)       Final Clinical Impressions(s) / ED Diagnoses   Final diagnoses:  Acute left-sided low back pain with left-sided sciatica    New Prescriptions Discharge Medication List as of 11/19/2016  4:36 PM    START taking these medications   Details  HYDROcodone-acetaminophen (NORCO/VICODIN) 5-325 MG tablet Take 1 tablet by mouth every 6 (six) hours as needed., Starting Sat 11/19/2016, Print    lidocaine (LIDODERM) 5 % Place 1 patch onto the skin daily. Remove & Discard patch within 12 hours or as directed by MD, Starting Sat 11/19/2016, Print    methocarbamol (ROBAXIN) 500 MG tablet Take 1 tablet (500 mg total) by mouth 2 (two) times daily., Starting Sat 11/19/2016, Print    predniSONE (STERAPRED UNI-PAK 21 TAB) 10 MG (21) TBPK tablet Take 1 tablet (10 mg total) by mouth daily. Take 6 tabs day 1, 5 tabs day 2, 4 tabs  day 3, 3 tabs day 4, 2 tabs day 5, and 1 tab on day 6., Starting Sat 11/19/2016, Print      I personally performed the services described in this documentation, which was scribed in my presence. The recorded information has been reviewed and is accurate.    Lorayne Bender, PA-C 11/21/16 1442    Leo Grosser, MD 11/23/16 [phone]

## 2016-11-19 NOTE — Discharge Instructions (Signed)
Take it easy, but do not lay around too much as this may make any stiffness worse. Take 500 mg of naproxen every 12 hours or 800 mg of ibuprofen every 8 hours for the next 3 days. Take these medications with food to avoid upset stomach. Robaxin is a muscle relaxer and may help loosen stiff muscles. Do not take the Robaxin while driving or performing other dangerous activities. Be sure to perform the attached exercises starting with three times a week and working up to performing them daily. This is an essential part of preventing long term problems. Follow up with a primary care provider for any future management of these complaints. °

## 2016-11-30 DIAGNOSIS — G8929 Other chronic pain: Secondary | ICD-10-CM | POA: Diagnosis not present

## 2016-11-30 DIAGNOSIS — M5442 Lumbago with sciatica, left side: Secondary | ICD-10-CM | POA: Diagnosis not present

## 2016-12-06 DIAGNOSIS — M5442 Lumbago with sciatica, left side: Secondary | ICD-10-CM | POA: Diagnosis not present

## 2016-12-06 DIAGNOSIS — M5136 Other intervertebral disc degeneration, lumbar region: Secondary | ICD-10-CM | POA: Diagnosis not present

## 2016-12-06 DIAGNOSIS — G8929 Other chronic pain: Secondary | ICD-10-CM | POA: Diagnosis not present

## 2016-12-28 DIAGNOSIS — M5136 Other intervertebral disc degeneration, lumbar region: Secondary | ICD-10-CM | POA: Diagnosis not present

## 2017-01-03 DIAGNOSIS — M4686 Other specified inflammatory spondylopathies, lumbar region: Secondary | ICD-10-CM | POA: Diagnosis not present

## 2017-01-03 DIAGNOSIS — M5126 Other intervertebral disc displacement, lumbar region: Secondary | ICD-10-CM | POA: Diagnosis not present

## 2017-01-03 DIAGNOSIS — M5136 Other intervertebral disc degeneration, lumbar region: Secondary | ICD-10-CM | POA: Diagnosis not present

## 2017-01-18 ENCOUNTER — Telehealth (INDEPENDENT_AMBULATORY_CARE_PROVIDER_SITE_OTHER): Payer: Self-pay | Admitting: *Deleted

## 2017-01-18 NOTE — Telephone Encounter (Signed)
See below

## 2017-01-18 NOTE — Telephone Encounter (Signed)
Pt wife called asking about patients recovery time and what to expect when he comes home. Pt wife states she can barely walk herself and doesn't know how she is going to be able to care for the pt when he is discharged.

## 2017-01-23 DIAGNOSIS — M4696 Unspecified inflammatory spondylopathy, lumbar region: Secondary | ICD-10-CM | POA: Diagnosis not present

## 2017-01-27 DIAGNOSIS — M5126 Other intervertebral disc displacement, lumbar region: Secondary | ICD-10-CM | POA: Diagnosis not present

## 2017-01-27 DIAGNOSIS — G894 Chronic pain syndrome: Secondary | ICD-10-CM | POA: Diagnosis not present

## 2017-01-27 DIAGNOSIS — M5442 Lumbago with sciatica, left side: Secondary | ICD-10-CM | POA: Diagnosis not present

## 2017-02-10 ENCOUNTER — Ambulatory Visit (HOSPITAL_COMMUNITY)
Admission: RE | Admit: 2017-02-10 | Discharge: 2017-02-10 | Disposition: A | Discharge status: Home / Self Care | Payer: Commercial Managed Care - HMO | Source: Ambulatory Visit | Attending: Orthopedic Surgery | Admitting: Orthopedic Surgery

## 2017-02-10 ENCOUNTER — Other Ambulatory Visit (HOSPITAL_COMMUNITY): Payer: Self-pay | Admitting: Orthopedic Surgery

## 2017-02-10 DIAGNOSIS — M7989 Other specified soft tissue disorders: Secondary | ICD-10-CM | POA: Diagnosis not present

## 2017-02-10 DIAGNOSIS — M79605 Pain in left leg: Secondary | ICD-10-CM | POA: Diagnosis not present

## 2017-02-10 DIAGNOSIS — M79606 Pain in leg, unspecified: Secondary | ICD-10-CM | POA: Insufficient documentation

## 2017-02-10 NOTE — Progress Notes (Signed)
**  Preliminary report by tech**  Left lower extremity venous duplex complete. There is no evidence of deep or superficial vein thrombosis involving the left lower extremity. All visualized vessels appear patent and compressible. There is no evidence of a Baker's cyst on the left. Results were given to [place] at Dr. Rolena Infante' office.  02/10/17 11:57 AM Tristan Wood RVT

## 2017-03-17 DIAGNOSIS — Z4789 Encounter for other orthopedic aftercare: Secondary | ICD-10-CM | POA: Diagnosis not present

## 2017-03-17 DIAGNOSIS — M5416 Radiculopathy, lumbar region: Secondary | ICD-10-CM | POA: Diagnosis not present

## 2017-03-20 DIAGNOSIS — Z4789 Encounter for other orthopedic aftercare: Secondary | ICD-10-CM | POA: Diagnosis not present

## 2017-03-20 DIAGNOSIS — M5416 Radiculopathy, lumbar region: Secondary | ICD-10-CM | POA: Diagnosis not present

## 2017-03-22 DIAGNOSIS — Z4789 Encounter for other orthopedic aftercare: Secondary | ICD-10-CM | POA: Diagnosis not present

## 2017-03-22 DIAGNOSIS — M5416 Radiculopathy, lumbar region: Secondary | ICD-10-CM | POA: Diagnosis not present

## 2017-04-04 DIAGNOSIS — M5416 Radiculopathy, lumbar region: Secondary | ICD-10-CM | POA: Diagnosis not present

## 2017-04-04 DIAGNOSIS — Z4789 Encounter for other orthopedic aftercare: Secondary | ICD-10-CM | POA: Diagnosis not present

## 2017-04-07 DIAGNOSIS — M5416 Radiculopathy, lumbar region: Secondary | ICD-10-CM | POA: Diagnosis not present

## 2017-04-07 DIAGNOSIS — Z4789 Encounter for other orthopedic aftercare: Secondary | ICD-10-CM | POA: Diagnosis not present

## 2017-04-13 DIAGNOSIS — Z4789 Encounter for other orthopedic aftercare: Secondary | ICD-10-CM | POA: Diagnosis not present

## 2017-04-13 DIAGNOSIS — M5416 Radiculopathy, lumbar region: Secondary | ICD-10-CM | POA: Diagnosis not present

## 2017-04-15 DIAGNOSIS — M5416 Radiculopathy, lumbar region: Secondary | ICD-10-CM | POA: Diagnosis not present

## 2017-04-15 DIAGNOSIS — Z4789 Encounter for other orthopedic aftercare: Secondary | ICD-10-CM | POA: Diagnosis not present

## 2017-04-18 DIAGNOSIS — M5416 Radiculopathy, lumbar region: Secondary | ICD-10-CM | POA: Diagnosis not present

## 2017-04-18 DIAGNOSIS — Z4789 Encounter for other orthopedic aftercare: Secondary | ICD-10-CM | POA: Diagnosis not present

## 2017-10-25 DIAGNOSIS — Z5181 Encounter for therapeutic drug level monitoring: Secondary | ICD-10-CM | POA: Diagnosis not present

## 2017-10-25 DIAGNOSIS — G4700 Insomnia, unspecified: Secondary | ICD-10-CM | POA: Diagnosis not present

## 2017-10-25 DIAGNOSIS — Z0000 Encounter for general adult medical examination without abnormal findings: Secondary | ICD-10-CM | POA: Diagnosis not present

## 2017-10-25 DIAGNOSIS — Z23 Encounter for immunization: Secondary | ICD-10-CM | POA: Diagnosis not present

## 2017-10-25 DIAGNOSIS — E7800 Pure hypercholesterolemia, unspecified: Secondary | ICD-10-CM | POA: Diagnosis not present

## 2017-10-27 ENCOUNTER — Other Ambulatory Visit: Payer: Self-pay | Admitting: Family Medicine

## 2018-01-23 ENCOUNTER — Other Ambulatory Visit: Payer: Self-pay

## 2018-01-23 ENCOUNTER — Emergency Department (HOSPITAL_BASED_OUTPATIENT_CLINIC_OR_DEPARTMENT_OTHER): Payer: 59

## 2018-01-23 ENCOUNTER — Encounter (HOSPITAL_BASED_OUTPATIENT_CLINIC_OR_DEPARTMENT_OTHER): Payer: Self-pay | Admitting: Emergency Medicine

## 2018-01-23 ENCOUNTER — Emergency Department (HOSPITAL_BASED_OUTPATIENT_CLINIC_OR_DEPARTMENT_OTHER)
Admission: EM | Admit: 2018-01-23 | Discharge: 2018-01-23 | Disposition: A | Discharge status: Home / Self Care | Payer: 59 | Attending: Emergency Medicine | Admitting: Emergency Medicine

## 2018-01-23 DIAGNOSIS — F17210 Nicotine dependence, cigarettes, uncomplicated: Secondary | ICD-10-CM | POA: Insufficient documentation

## 2018-01-23 DIAGNOSIS — X500XXA Overexertion from strenuous movement or load, initial encounter: Secondary | ICD-10-CM | POA: Diagnosis not present

## 2018-01-23 DIAGNOSIS — Y929 Unspecified place or not applicable: Secondary | ICD-10-CM | POA: Insufficient documentation

## 2018-01-23 DIAGNOSIS — S93402A Sprain of unspecified ligament of left ankle, initial encounter: Secondary | ICD-10-CM | POA: Diagnosis not present

## 2018-01-23 DIAGNOSIS — M25572 Pain in left ankle and joints of left foot: Secondary | ICD-10-CM | POA: Diagnosis not present

## 2018-01-23 DIAGNOSIS — S99912A Unspecified injury of left ankle, initial encounter: Secondary | ICD-10-CM | POA: Diagnosis not present

## 2018-01-23 DIAGNOSIS — Y999 Unspecified external cause status: Secondary | ICD-10-CM | POA: Diagnosis not present

## 2018-01-23 DIAGNOSIS — Y9301 Activity, walking, marching and hiking: Secondary | ICD-10-CM | POA: Insufficient documentation

## 2018-01-23 DIAGNOSIS — Z79899 Other long term (current) drug therapy: Secondary | ICD-10-CM | POA: Diagnosis not present

## 2018-01-23 DIAGNOSIS — M7989 Other specified soft tissue disorders: Secondary | ICD-10-CM | POA: Diagnosis not present

## 2018-01-23 DIAGNOSIS — W101XXA Fall (on)(from) sidewalk curb, initial encounter: Secondary | ICD-10-CM | POA: Diagnosis not present

## 2018-01-23 MED ORDER — IBUPROFEN 800 MG PO TABS
800.0000 mg | ORAL_TABLET | Freq: Once | ORAL | Status: AC
  Administered 2018-01-23: 800 mg via ORAL
  Filled 2018-01-23: qty 1

## 2018-01-23 MED ORDER — NAPROXEN 500 MG PO TABS: 500.0000 mg | ORAL_TABLET | Freq: Two times a day (BID) | ORAL | 0 refills | Status: DC

## 2018-01-23 NOTE — ED Notes (Signed)
ED Provider at bedside. 

## 2018-01-23 NOTE — ED Triage Notes (Signed)
Patient states that he fell about 2 - 3 hours ago and hurt his left ankle

## 2018-01-23 NOTE — Discharge Instructions (Addendum)
Please read and follow all provided instructions.  Your diagnoses today include: Ankle sprain An ankle sprain is an injury to the ligaments that hold the ankle joint together causing them to get stretched or torn. It may take 4-6 weeks to heal fully. Your X-ray today showed no evidence of fracture (there is no evidence of broken bones).  For activity: Use crutches with non-weightbearing for the first few days. Exercises should be limited to pain free range of motion. You can start mobilization by tracing the alphabet with you foot in the air. Then, you may walk on your ankles as the pain allows (or as instructed). Start gradually with weight bearing on the affected ankle. Once you can walk pain free, then try jogging. When you can run forwards, then you can try moving side to side. If you cannot walk without crutches in one week, you need a recheck by your Family Doctor. It is important to keep all follow-up appointments as we discussed fractures may not appear until 1 week to 10 days after the acute injury. If you do not have a family doctor to follow-up with, you can see the list of phone numbers below. Please call today to make a followup appointment.  TREATMENT  Rest, ice, compression and elevation (RICE therapy) are the basic modes of treatment.   Rest is needed to allow your body to heal. Routine activities can be resumed when comfortable (as described above). Injury tendons and bones can take up to 6 weeks to heal. Tendons are cordlike structures that attach muscles and bones Ice: Apply ice to the sore area for 15 to 20 minutes, 3 to 4 times per day. Do this while you are awake for the first 2 days, or as directed. This can help reduce swelling and reduce pain.  Compression: this helps keep swelling down. It also gives support and helps with discomfort. If any lasting bandage has been applied, it should be removed and reapplied every 3-4 hours. It should not be applied tightly, but firmly enough to  keep swelling down. Watch fingers or toes for swelling, discoloration, coldness, numbness or excessive pain. If any of these problems occur, removed the bandage and reapply loosely. Contact your caregiver if these problems continue. If you were given an ankle stabilizer you may take it off at night and to take a shower or bath. Wiggle your toes in the splint several times per day if you are able.  Elevation helps reduce swelling and decrease your pain. With extremities such as the arms, hands, legs and feet, the injured area should be placed near or above the level of the heart if possible (place pillows underneath you leg/foot while you sleep to achieve this).  HOW TO MAKE AN ICE PACK  To make an ice pack, do one of the following:  Place crushed ice or a bag of frozen vegetables in a sealable plastic bag. Squeeze out the excess air. Place this bag inside another plastic bag. Slide the bag into a pillowcase or place a damp towel between your skin and the bag.  Mix 3 parts water with 1 part rubbing alcohol. Freeze the mixture in a sealable plastic bag. When you remove the mixture from the freezer, it will be slushy. Squeeze out the excess air. Place this bag inside another plastic bag. Slide the bag into a pillowcase or place a damp towel between your sk  Seek immediate medical attention if: You're toes are numb or tingling, appear gray or blue, or  you have severe pain. If this occurs please also elevate the leg and loosen the splint. Also if you have persistent pain and swelling, developed redness numbness or unexpected weakness, or your symptoms are getting worse rather than improving after several days. The symptoms may indicate that further evaluation or further x-rays are needed. Sometimes, x-rays may not show a small broken bone until one week or 10 days later. Make a follow-up appointment with your caregiver.  Additional Information:  Your vital signs today were: BP 132/85 (BP Location: Left Arm)     Pulse 94    Temp 98 F (36.7 C) (Oral)    Resp 18    SpO2 99%  If your blood pressure (BP) was elevated above 135/85 this visit, please have this repeated by your doctor within one month. ---------------

## 2018-01-23 NOTE — ED Provider Notes (Signed)
[facility] EMERGENCY DEPARTMENT Provider Note   CSN: [id number] Arrival date & time: 01/23/18  1325     History   Chief Complaint Chief Complaint  Patient presents with  . Ankle Pain    HPI Tristan Wood is a 52 y.o. male who presents the emergency department today for left ankle pain after inversion injury while stepping off curb approximately 4 hours ago.  Patient notes pain, swelling to the lateral aspect of his ankle along the lateral malleolus.  His pain is worse with movement and ambulation.  He has been able to bear weight on the ankle since the event.  He was given Motrin on arrival as well as ice that has helped him pain and swelling.  He denies any open wounds, numbness/tingling or decreased range of motion.  HPI  Past Medical History:  Diagnosis Date  . Basal cell carcinoma   . Cancer ([place])   . GERD (gastroesophageal reflux disease)   . High cholesterol   . Kidney stone   . Kidney stones   . Spinal stenosis     There are no active problems to display for this patient.   Past Surgical History:  Procedure Laterality Date  . CYSTOSCOPY WITH RETROGRADE PYELOGRAM, URETEROSCOPY AND STENT PLACEMENT Right 08/29/2014   Procedure: CYSTOSCOPY WITH RETROGRADE PYELOGRAM, URETEROSCOPY, STONE REMOVAL;  Surgeon: Festus Aloe, MD;  Location[facility];  Service: Urology;  Laterality: Right;  . CYSTOSCOPY WITH RETROGRADE PYELOGRAM, URETEROSCOPY AND STENT PLACEMENT Left 09/18/2015   Procedure: CYSTOSCOPY WITH LEFT URETEROSCOPY LEFT RETROGRADE PYELOGRAM ;  Surgeon: Festus Aloe, MD;  Location: [facility];  Service: Urology;  Laterality: Left;  . MOHS SURGERY         Home Medications    Prior to Admission medications   Medication Sig Start Date End Date Taking? Authorizing Provider  atorvastatin (LIPITOR) 10 MG tablet Take 10 mg by mouth daily.    [provider]  HYDROcodone-acetaminophen (NORCO/VICODIN) 5-325 MG tablet Take 1  tablet by mouth every 6 (six) hours as needed. 11/19/16   Joy, Shawn C, PA-C  lidocaine (LIDODERM) 5 % Place 1 patch onto the skin daily. Remove & Discard patch within 12 hours or as directed by MD 11/19/16   Joy, Shawn C, PA-C  methocarbamol (ROBAXIN) 500 MG tablet Take 1 tablet (500 mg total) by mouth 2 (two) times daily. 11/19/16   Joy, Shawn C, PA-C  omeprazole (PRILOSEC) 20 MG capsule Take 20 mg by mouth daily.    [provider]  ondansetron (ZOFRAN ODT) 4 MG disintegrating tablet Take 1 tablet (4 mg total) by mouth every 8 (eight) hours as needed for nausea or vomiting. 09/14/15   Little, Wenda Overland, MD  oxyCODONE-acetaminophen (PERCOCET) 5-325 MG tablet Take 1 tablet by mouth every 6 (six) hours as needed for severe pain. 09/14/15   Little, Wenda Overland, MD  predniSONE (STERAPRED UNI-PAK 21 TAB) 10 MG (21) TBPK tablet Take 1 tablet (10 mg total) by mouth daily. Take 6 tabs day 1, 5 tabs day 2, 4 tabs day 3, 3 tabs day 4, 2 tabs day 5, and 1 tab on day 6. 11/19/16   Joy, Shawn C, PA-C  tamsulosin (FLOMAX) 0.4 MG CAPS capsule Take 1 capsule (0.4 mg total) by mouth daily. 09/14/15   Little, Wenda Overland, MD  zolpidem (AMBIEN) 10 MG tablet Take 10 mg by mouth at bedtime as needed for sleep.    [provider]    Family History History  reviewed. No pertinent family history.  Social History Social History   Tobacco Use  . Smoking status: Current Every Day Smoker    Packs/day: 2.00    Years: 30.00    Pack years: 60.00    Types: Cigarettes  . Smokeless tobacco: Never Used  Substance Use Topics  . Alcohol use: No  . Drug use: No     Allergies   Patient has no known allergies.   Review of Systems Review of Systems  Musculoskeletal: Positive for arthralgias and joint swelling.  Skin: Negative for color change and wound.  Neurological: Negative for weakness and numbness.     Physical Exam Updated Vital Signs BP 132/85 (BP Location: Left Arm)   Pulse 94    Temp 98 F (36.7 C) (Oral)   Resp 18   SpO2 99%   Physical Exam  Constitutional: He appears well-developed and well-nourished.  HENT:  Head: Normocephalic and atraumatic.  Right Ear: External ear normal.  Left Ear: External ear normal.  Eyes: Conjunctivae are normal. Right eye exhibits no discharge. Left eye exhibits no discharge. No scleral icterus.  Cardiovascular:  Pulses:      Dorsalis pedis pulses are 2+ on the left side.       Posterior tibial pulses are 2+ on the left side.  Pulmonary/Chest: Effort normal. No respiratory distress.  Musculoskeletal:       Left ankle: He exhibits swelling (lateral malleolus). He exhibits normal range of motion, no ecchymosis, no deformity, no laceration and normal pulse. Tenderness. Lateral malleolus tenderness found. No medial malleolus, no AITFL, no CF ligament, no posterior TFL, no head of 5th metatarsal and no proximal fibula tenderness found. Achilles tendon normal. Achilles tendon exhibits no pain, no defect and normal Thompson's test results.       Left foot: Normal.       Feet:  Neurological: He is alert.  Skin: No pallor.  Psychiatric: He has a normal mood and affect.  Nursing note and vitals reviewed.    ED Treatments / Results  Labs (all labs ordered are listed, but only abnormal results are displayed) Labs Reviewed - No data to display  EKG  EKG Interpretation None       Radiology Dg Ankle Complete Left  Result Date: 01/23/2018 CLINICAL DATA:  [place] twisting the left ankle with pain and swelling laterally EXAM: LEFT ANKLE COMPLETE - 3+ VIEW COMPARISON:  None. FINDINGS: The ankle joint appears normal. Alignment is normal. No fracture is seen. There is soft tissue swelling however over the lateral malleolus. IMPRESSION: No acute fracture.  Soft tissue swelling laterally. Electronically Signed   By: Ivar Drape M.D.   On: 01/23/2018 13:57    Procedures Procedures (including critical care time)  Medications Ordered in  ED Medications  ibuprofen (ADVIL,MOTRIN) tablet 800 mg (800 mg Oral Given 01/23/18 1441)     Initial Impression / Assessment and Plan / ED Course  I have reviewed the triage vital signs and the nursing notes.  Pertinent labs & imaging results that were available during my care of the patient were reviewed by me and considered in my medical decision making (see chart for details).     52 y.o. male presenting with lateral ankle pain and swelling after inversion ankle injury. He is NVI. No open wounds. No evidence of achilles tendon rupture. Patient X-Ray negative for obvious fracture or dislocation. Pain managed in ED. Pt advised to follow up with orthopedics if symptoms persist for possibility of missed fracture diagnosis.  Patient given ASO brace & crutches while in ED, conservative therapy recommended and discussed.  Will discharge patient home with naproxen.  He denies history of GI bleed or kidney disease.  Review of last creatinine on lab work shows normal kidney function.  Return precautions discussed. Patient will be dc home & is agreeable with above plan.  Final Clinical Impressions(s) / ED Diagnoses   Final diagnoses:  Sprain of left ankle, unspecified ligament, initial encounter    ED Discharge Orders        Ordered    naproxen (NAPROSYN) 500 MG tablet  2 times daily     01/23/18 1533       Lorelle Gibbs 01/23/18 1534    Drenda Freeze, MD 01/23/18 1539

## 2018-03-13 ENCOUNTER — Emergency Department (HOSPITAL_BASED_OUTPATIENT_CLINIC_OR_DEPARTMENT_OTHER)
Admission: EM | Admit: 2018-03-13 | Discharge: 2018-03-13 | Disposition: A | Discharge status: Home / Self Care | Payer: 59 | Attending: Emergency Medicine | Admitting: Emergency Medicine

## 2018-03-13 ENCOUNTER — Encounter (HOSPITAL_BASED_OUTPATIENT_CLINIC_OR_DEPARTMENT_OTHER): Payer: Self-pay | Admitting: *Deleted

## 2018-03-13 ENCOUNTER — Emergency Department (HOSPITAL_BASED_OUTPATIENT_CLINIC_OR_DEPARTMENT_OTHER): Payer: 59

## 2018-03-13 ENCOUNTER — Other Ambulatory Visit: Payer: Self-pay

## 2018-03-13 DIAGNOSIS — N201 Calculus of ureter: Secondary | ICD-10-CM

## 2018-03-13 DIAGNOSIS — F17210 Nicotine dependence, cigarettes, uncomplicated: Secondary | ICD-10-CM | POA: Diagnosis not present

## 2018-03-13 DIAGNOSIS — Z79899 Other long term (current) drug therapy: Secondary | ICD-10-CM | POA: Diagnosis not present

## 2018-03-13 DIAGNOSIS — N202 Calculus of kidney with calculus of ureter: Secondary | ICD-10-CM | POA: Diagnosis not present

## 2018-03-13 DIAGNOSIS — R1032 Left lower quadrant pain: Secondary | ICD-10-CM | POA: Diagnosis present

## 2018-03-13 DIAGNOSIS — R109 Unspecified abdominal pain: Secondary | ICD-10-CM | POA: Diagnosis not present

## 2018-03-13 LAB — URINALYSIS, ROUTINE W REFLEX MICROSCOPIC
BILIRUBIN URINE: NEGATIVE
Glucose, UA: NEGATIVE mg/dL
Ketones, ur: NEGATIVE mg/dL
Leukocytes, UA: NEGATIVE
Nitrite: NEGATIVE
PH: 6 (ref 5.0–8.0)
Protein, ur: NEGATIVE mg/dL

## 2018-03-13 LAB — URINALYSIS, MICROSCOPIC (REFLEX)

## 2018-03-13 MED ORDER — ONDANSETRON 8 MG PO TBDP: 8.0000 mg | ORAL_TABLET | Freq: Three times a day (TID) | ORAL | 0 refills | Status: DC | PRN

## 2018-03-13 MED ORDER — ALBUTEROL SULFATE HFA 108 (90 BASE) MCG/ACT IN AERS
2.0000 | INHALATION_SPRAY | RESPIRATORY_TRACT | Status: DC | PRN
  Administered 2018-03-13: 2 via RESPIRATORY_TRACT
  Filled 2018-03-13: qty 6.7

## 2018-03-13 MED ORDER — HYDROMORPHONE HCL 1 MG/ML IJ SOLN
1.0000 mg | Freq: Once | INTRAMUSCULAR | Status: AC
  Administered 2018-03-13: 1 mg via INTRAVENOUS
  Filled 2018-03-13: qty 1

## 2018-03-13 MED ORDER — ONDANSETRON HCL 4 MG/2ML IJ SOLN
4.0000 mg | Freq: Once | INTRAMUSCULAR | Status: AC
  Administered 2018-03-13: 4 mg via INTRAVENOUS
  Filled 2018-03-13: qty 2

## 2018-03-13 MED ORDER — HYDROMORPHONE HCL 4 MG PO TABS: 4.0000 mg | ORAL_TABLET | ORAL | 0 refills | Status: DC | PRN

## 2018-03-13 NOTE — ED Notes (Signed)
Patient transported to CT 

## 2018-03-13 NOTE — ED Provider Notes (Signed)
[facility] DEPT [facility] Provider Note: Georgena Spurling, MD, FACEP  CSN: [id number] MRN: [id number] ARRIVAL: 03/13/18 at [place]: [place]  Flank Pain   HISTORY OF PRESENT ILLNESS  03/13/18 2:25 AM Tristan Wood is a 52 y.o. male with a history of kidney stones.  He is here with left flank pain that began about 8 hours ago.  The pain is moderate to severe and characterized as like previous kidney stones.  It radiates into his left groin.  He has had associated nausea but no vomiting.  He has not noticed gross hematuria.  Pain is not significantly changed with movement.  He states he recently got over "the flu" and has had a persistent cough.  Consultation with the [facility] state controlled substances database reveals the patient has received no opioid prescriptions in the last year.   Past Medical History:  Diagnosis Date  . Basal cell carcinoma   . GERD (gastroesophageal reflux disease)   . High cholesterol   . Kidney stones   . Spinal stenosis     Past Surgical History:  Procedure Laterality Date  . CYSTOSCOPY WITH RETROGRADE PYELOGRAM, URETEROSCOPY AND STENT PLACEMENT Right 08/29/2014   Procedure: CYSTOSCOPY WITH RETROGRADE PYELOGRAM, URETEROSCOPY, STONE REMOVAL;  Surgeon: Festus Aloe, MD;  Location: [facility];  Service: Urology;  Laterality: Right;  . CYSTOSCOPY WITH RETROGRADE PYELOGRAM, URETEROSCOPY AND STENT PLACEMENT Left 09/18/2015   Procedure: CYSTOSCOPY WITH LEFT URETEROSCOPY LEFT RETROGRADE PYELOGRAM ;  Surgeon: Festus Aloe, MD;  Location: [facility];  Service: Urology;  Laterality: Left;  . MOHS SURGERY      History reviewed. No pertinent family history.  Social History   Tobacco Use  . Smoking status: Current Every Day Smoker    Packs/day: 2.00    Years: 30.00    Pack years: 60.00    Types: Cigarettes  . Smokeless tobacco: Never Used  Substance Use Topics  . Alcohol use: No  . Drug use: No    Prior  to Admission medications   Medication Sig Start Date End Date Taking? Authorizing Provider  atorvastatin (LIPITOR) 10 MG tablet Take 10 mg by mouth daily.    [provider]  HYDROcodone-acetaminophen (NORCO/VICODIN) 5-325 MG tablet Take 1 tablet by mouth every 6 (six) hours as needed. 11/19/16   Joy, Shawn C, PA-C  lidocaine (LIDODERM) 5 % Place 1 patch onto the skin daily. Remove & Discard patch within 12 hours or as directed by MD 11/19/16   Joy, Shawn C, PA-C  methocarbamol (ROBAXIN) 500 MG tablet Take 1 tablet (500 mg total) by mouth 2 (two) times daily. 11/19/16   Joy, Shawn C, PA-C  naproxen (NAPROSYN) 500 MG tablet Take 1 tablet (500 mg total) by mouth 2 (two) times daily. 01/23/18   Maczis, Barth Kirks, PA-C  omeprazole (PRILOSEC) 20 MG capsule Take 20 mg by mouth daily.    [provider]  ondansetron (ZOFRAN ODT) 4 MG disintegrating tablet Take 1 tablet (4 mg total) by mouth every 8 (eight) hours as needed for nausea or vomiting. 09/14/15   Little, Wenda Overland, MD  oxyCODONE-acetaminophen (PERCOCET) 5-325 MG tablet Take 1 tablet by mouth every 6 (six) hours as needed for severe pain. 09/14/15   Little, Wenda Overland, MD  predniSONE (STERAPRED UNI-PAK 21 TAB) 10 MG (21) TBPK tablet Take 1 tablet (10 mg total) by mouth daily. Take 6 tabs day 1, 5 tabs day 2, 4 tabs day 3, 3 tabs day 4,  2 tabs day 5, and 1 tab on day 6. 11/19/16   Joy, Shawn C, PA-C  tamsulosin (FLOMAX) 0.4 MG CAPS capsule Take 1 capsule (0.4 mg total) by mouth daily. 09/14/15   Little, Wenda Overland, MD  zolpidem (AMBIEN) 10 MG tablet Take 10 mg by mouth at bedtime as needed for sleep.    [provider]    Allergies Patient has no known allergies.   REVIEW OF SYSTEMS  Negative except as noted here or in the History of Present Illness.   PHYSICAL EXAMINATION  Initial Vital Signs Blood pressure (!) 142/92, pulse 83, temperature 97.8 F (36.6 C), temperature source Oral, resp. rate 18,  height 6' (1.829 m), weight 104.3 kg (230 lb), SpO2 97 %.  Examination General: Well-developed, well-nourished male in no acute distress; appearance consistent with age of record HENT: normocephalic; atraumatic Eyes: pupils equal, round and reactive to light; extraocular muscles intact Neck: supple Heart: regular rate and rhythm Lungs: clear to auscultation bilaterally Abdomen: soft; nondistended; nontender; bowel sounds present GU: Mild left CVA tenderness Extremities: No deformity; full range of motion; pulses normal Neurologic: Awake, alert and oriented; motor function intact in all extremities and symmetric; no facial droop Skin: Warm and dry Psychiatric: Flat affect   RESULTS  Summary of this visit's results, reviewed by myself:   EKG Interpretation  Date/Time:    Ventricular Rate:    PR Interval:    QRS Duration:   QT Interval:    QTC Calculation:   R Axis:     Text Interpretation:        Laboratory Studies: Results for orders placed or performed during the hospital encounter of 03/13/18 (from the past 24 hour(s))  Urinalysis, Routine w reflex microscopic     Status: Abnormal   Collection Time: 03/13/18 12:37 AM  Result Value Ref Range   Color, Urine YELLOW YELLOW   APPearance CLOUDY (A) CLEAR   Specific Gravity, Urine >1.030 (H) 1.005 - 1.030   pH 6.0 5.0 - 8.0   Glucose, UA NEGATIVE NEGATIVE mg/dL   Hgb urine dipstick LARGE (A) NEGATIVE   Bilirubin Urine NEGATIVE NEGATIVE   Ketones, ur NEGATIVE NEGATIVE mg/dL   Protein, ur NEGATIVE NEGATIVE mg/dL   Nitrite NEGATIVE NEGATIVE   Leukocytes, UA NEGATIVE NEGATIVE  Urinalysis, Microscopic (reflex)     Status: Abnormal   Collection Time: 03/13/18 12:37 AM  Result Value Ref Range   RBC / HPF TOO NUMEROUS TO COUNT 0 - 5 RBC/hpf   WBC, UA 0-5 0 - 5 WBC/hpf   Bacteria, UA RARE (A) NONE SEEN   Squamous Epithelial / LPF 0-5 (A) NONE SEEN   Imaging Studies: Ct Renal Stone Study  Result Date: 03/13/2018 CLINICAL  DATA:  LEFT flank to LEFT lower quadrant pain for 8 hours. History of kidney stones, lithotripsy, cystoscopy. EXAM: CT ABDOMEN AND PELVIS WITHOUT CONTRAST TECHNIQUE: Multidetector CT imaging of the abdomen and pelvis was performed following the standard protocol without IV contrast. COMPARISON:  CT abdomen and pelvis September 14, 2015 FINDINGS: LOWER CHEST: Lung bases are clear. Trace bronchial wall thickening. The visualized heart size is normal. Mild coronary artery calcification. No pericardial effusion. HEPATOBILIARY: Normal. PANCREAS: Normal. SPLEEN: Normal. ADRENALS/URINARY TRACT: Kidneys are orthotopic, demonstrating normal size and morphology. Punctate RIGHT interpolar nephrolithiasis. Limited assessment for renal masses on this nonenhanced examination. 13 mm cyst LEFT interpolar kidney. Mild LEFT hydroureteronephrosis the level of the mid ureter where a 3 mm calculus is present. Urinary bladder is well distended and  unremarkable. Normal adrenal glands. STOMACH/BOWEL: The stomach, small and large bowel are normal in course and caliber without inflammatory changes, sensitivity decreased by lack of enteric contrast. A few scattered colonic diverticula noted. Normal appendix. VASCULAR/LYMPHATIC: Aortoiliac vessels are normal in course and caliber. Mild calcific atherosclerosis. No lymphadenopathy by CT size criteria. REPRODUCTIVE: Borderline prostatomegaly. OTHER: No intraperitoneal free fluid or free air. MUSCULOSKELETAL: Non-acute. Small fat containing umbilical hernias. Small fat containing umbilical hernia. Severe L5-S1 degenerative disc, mild at L4-5. Severe RIGHT L4-5 and L5-S1 neural foraminal narrowing. Severe LEFT L3-4 through L5-S1 neural foraminal narrowing. Multilevel Schmorl's nodes. IMPRESSION: 1. 3 mm mid LEFT ureteral calculus resulting in mild obstructive uropathy. 2. Punctate nonobstructing RIGHT nephrolithiasis. 3. Severe L3-4 through L5-S1 neural foraminal narrowing. Aortic Atherosclerosis  (ICD10-I70.0). Electronically Signed   By: Elon Alas M.D.   On: 03/13/2018 03:03    ED COURSE  Nursing notes and initial vitals signs, including pulse oximetry, reviewed.  Vitals:   03/13/18 0034 03/13/18 0036 03/13/18 0233 03/13/18 0300  BP:  (!) 142/92  138/86  Pulse:  83  75  Resp:  18  18  Temp:  97.8 F (36.6 C)  98.9 F (37.2 C)  TempSrc:  Oral    SpO2:  97% 97% 98%  Weight: 104.3 kg (230 lb)     Height: 6' (1.829 m)      3:50 AM Pain significantly improved with IV Dilaudid.  Patient requesting 1 more dose prior to discharge.  He has a urologist, Dr. Junious Silk, with whom he will follow-up.  PROCEDURES    ED DIAGNOSES     ICD-10-CM   1. Ureterolithiasis N20.1        Jillene Wehrenberg, MD 03/13/18 [id number]

## 2018-03-13 NOTE — ED Triage Notes (Signed)
Pt c/o left flank pain x 6 hrs ago HX stones

## 2018-03-14 DIAGNOSIS — N201 Calculus of ureter: Secondary | ICD-10-CM | POA: Diagnosis not present

## 2018-07-06 DIAGNOSIS — B078 Other viral warts: Secondary | ICD-10-CM | POA: Diagnosis not present

## 2018-07-06 DIAGNOSIS — L02229 Furuncle of trunk, unspecified: Secondary | ICD-10-CM | POA: Diagnosis not present

## 2018-07-06 DIAGNOSIS — B9689 Other specified bacterial agents as the cause of diseases classified elsewhere: Secondary | ICD-10-CM | POA: Diagnosis not present

## 2018-07-11 ENCOUNTER — Other Ambulatory Visit: Payer: Self-pay | Admitting: Family Medicine

## 2018-07-11 DIAGNOSIS — Z720 Tobacco use: Secondary | ICD-10-CM

## 2018-08-08 ENCOUNTER — Ambulatory Visit
Admission: RE | Admit: 2018-08-08 | Discharge: 2018-08-08 | Disposition: A | Discharge status: Home / Self Care | Payer: 59 | Source: Ambulatory Visit | Attending: Family Medicine | Admitting: Family Medicine

## 2018-08-08 DIAGNOSIS — Z720 Tobacco use: Secondary | ICD-10-CM

## 2018-08-08 DIAGNOSIS — F17210 Nicotine dependence, cigarettes, uncomplicated: Secondary | ICD-10-CM | POA: Diagnosis not present

## 2018-08-14 DIAGNOSIS — R52 Pain, unspecified: Secondary | ICD-10-CM | POA: Diagnosis not present

## 2018-08-14 DIAGNOSIS — F17210 Nicotine dependence, cigarettes, uncomplicated: Secondary | ICD-10-CM | POA: Diagnosis not present

## 2018-08-14 DIAGNOSIS — J45909 Unspecified asthma, uncomplicated: Secondary | ICD-10-CM | POA: Diagnosis not present

## 2019-01-29 DIAGNOSIS — M545 Low back pain: Secondary | ICD-10-CM | POA: Diagnosis not present

## 2019-02-05 DIAGNOSIS — M545 Low back pain: Secondary | ICD-10-CM | POA: Diagnosis not present

## 2019-02-08 DIAGNOSIS — M5432 Sciatica, left side: Secondary | ICD-10-CM | POA: Diagnosis not present

## 2019-02-08 DIAGNOSIS — M545 Low back pain: Secondary | ICD-10-CM | POA: Diagnosis not present

## 2019-02-13 DIAGNOSIS — M545 Low back pain: Secondary | ICD-10-CM | POA: Diagnosis not present

## 2019-02-13 DIAGNOSIS — M5432 Sciatica, left side: Secondary | ICD-10-CM | POA: Diagnosis not present

## 2019-02-15 DIAGNOSIS — M545 Low back pain: Secondary | ICD-10-CM | POA: Diagnosis not present

## 2019-02-15 DIAGNOSIS — M5432 Sciatica, left side: Secondary | ICD-10-CM | POA: Diagnosis not present

## 2019-02-18 DIAGNOSIS — M545 Low back pain: Secondary | ICD-10-CM | POA: Diagnosis not present

## 2019-02-26 DIAGNOSIS — Z0000 Encounter for general adult medical examination without abnormal findings: Secondary | ICD-10-CM | POA: Diagnosis not present

## 2019-06-17 ENCOUNTER — Ambulatory Visit: Payer: Self-pay | Admitting: Orthopedic Surgery

## 2019-06-21 ENCOUNTER — Ambulatory Visit: Payer: Self-pay | Admitting: Orthopedic Surgery

## 2019-06-21 NOTE — H&P (Signed)
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 They are scheduled for XLIF L3-4 and PSFI L3-4 on 06-27-19 with Dr. Rolena Infante at [facility] to treat his progressive back buttock and recurrent left radicular leg pain that he has been dealing with since March. 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 We have had a long conversation about treatment options. This could include physical therapy, injection therapy, or even revision surgery. He is indicated he does not want to move forward with the injections or therapy. He has had the therapy and the injections in the past at this point his quality-of-life is so poor that he would like to move forward with surgery. At this point we have obtained a surgical clearance from his PCP. He has his old LSO brace which he is taking to PT today to make sure it is still fitted and appropriate. He has his pre-op appointment at [facility] scheduled for the 27th.  Reviewed Problems Hypertensive disorder - Onset: 01/29/2019 Lumbar post-laminectomy syndrome - Onset: 02/18/2019 Low back pain - Onset: 01/29/2019  Past Medical History:  Diagnosis Date  . Basal cell carcinoma   . GERD (gastroesophageal reflux disease)   . High cholesterol   . Kidney stones   . Spinal stenosis     Past Surgical History:  Procedure Laterality Date  . CYSTOSCOPY WITH RETROGRADE PYELOGRAM, URETEROSCOPY AND STENT PLACEMENT Right 08/29/2014   Procedure: CYSTOSCOPY WITH RETROGRADE PYELOGRAM, URETEROSCOPY, STONE REMOVAL;  Surgeon: Festus Aloe, MD;  [facility];  Service: Urology;  Laterality: Right;  . CYSTOSCOPY WITH RETROGRADE PYELOGRAM, URETEROSCOPY AND STENT PLACEMENT Left 09/18/2015   Procedure: CYSTOSCOPY WITH LEFT URETEROSCOPY LEFT RETROGRADE PYELOGRAM ;   Surgeon: Festus Aloe, MD;  Location: [facility];  Service: Urology;  Laterality: Left;  . MOHS SURGERY      Reviewed Social History Tobacco Smoking Status: Current every day smoker Smoker (1 PPD) Tobacco-years of use: 40 Occupation: [employer] Chewing tobacco: none Alcohol intake: None Hand Dominance: Right Work related injury?: N Advance directive: Y Freight forwarder of Attorney: Y Live alone or with others?: with others Marital status: Married  Reviewed Allergies NKDA   Reviewed Family History Mother - Congestive heart failure   - Diabetes mellitus  Review of Systems As stated in HPI  Objective:   General: AAOX3, well developed and well nourished, NAD Ambulation Normal uses no assitive devices Heart: RRR, no rubs, murmers, or gallops. Lungs: CTAB Abdomen: Normal bowel soundsX4, soft, non-tender, no hepatosplenomegaly.  Palpation: Non-tender over bilateral SI joints.  ROM: -Spine: normal ROM, pain elicited with fwd flexion, rotation and extension. - Knee: flexion and extension normal and pain free bilaterally. - Ankle: Dorsiflexion, plantarflexion, inversion, eversion normal and pain free.  Dermatomes: Lower extremity sensation to light touch abnormal with Numbness and dysesthesias primarily in the left L3 dermatome  Myotomes: - Hip Flexion: Left 5/5, Right 5/5 -Hip Adduction: Left 5/5, Right 5/5 - Knee Extension: Left 5/5, Right 5/5 - Knee Flexion: Left 5/5, Right 5/5 - Ankle Dorsiflextion: Left 5/5, Right 5/5 - Ankle Eversion: Left 5/5, Right 5/5 - Ankle Plantarflexion: Left 5/5, Right 5/5  Reflexes: - Patella: Left2+, Right 2+ - Achilles: Left2+, Right 2+ - Babinski: Left Ngative, Right Negative - Clonus: Negative  Special Tests: - Femoral stretch test: Left  positive, Right NEgative - Straight Leg Raise: Left Negative, Right Negative  PV: Extremities warm and well profused. Posterior and dorsalis pedis pulse 2+ bilaterally, No pitting Edema,  discoloration, calf tenderness, or palpable cords. Homan's negative bilaterally.  Lumbar MRI a week: completed on 02/13/19 was reviewed with the patient. I have also reviewed the radiology report. Recurrent L3-4 far lateral foraminal disc herniation. Loss of normal disc space height as well as facet arthrosis producing severe left neural foraminal narrowing and compression of the exiting L3 nerve. This has progressed from previous MRI in 2018. Moderate disc osteophyte complex at L4-5. Mild facet arthrosis and mild disc height loss. L5-S1 severe bulging disc osteophyte complex asymmetric to the right. Moderate facet arthrosis. Significant loss of disc space height without displacement or spinal canal stenosis. Severe right neural foraminal narrowing minimal on the left.  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 Imaging studies demonstrate recurrent far lateral disc herniation at L3-4 with impingement on the exiting left L3 nerve root as well as moderate disc space loss. He does have some disease at the L3-4 and L5-S1 levels but there primarily right-sided neural compression and is not having severe right neuropathic leg pain. His clinical exam is also more consistent with L3 neuropathic pain.  At this point time I do believe that the recurrent disc herniation and degenerative disease at the L3-4 level is his primary pain generator. We have had a long conversation about treatment options. This could include physical therapy, injection therapy, or even revision surgery. He is indicated he does not want to move forward with the injections or therapy. He has had the therapy and the injections in the past at this point his quality-of-life is so poor that he would like to move forward with surgery. At this point I think the best option is the fusion surgery at L3-4. He is or he had a prior discectomy and has  degenerative disc disease and recurrent disc herniation as well as foraminal stenosis. At this point I think by restoring intervertebral height and maintaining it we can indirectly decompress the neural foramen and provide relief for his nerve pain. I did tell him that if the neuropathic pain persist then we may need to consider a direct posterior revision decompression but I would like to avoid this given the fact he is already had surgery at that level.  Plan:   Move forward with excellent L3-4 with posterior spinal fusion instrumentation L3/4 on 06/27/2019 at [facility] with Dr. Rolena Infante.  I have gone over the surgical procedure as well as the risks and benefits. All of his questions were encouraged and addressed. Risks, benefits of surgery were reviewed with the patient. These include: infection, bleeding, death, stroke, paralysis, ongoing or worse pain, need for additional surgery, injury to the lumbar plexus resulting in hip flexor weakness and difficulty walking without assistive devices. Adjacent segment degenerative disease, need for additional surgery including fusing other levels, leak of spinal fluid, Nonunion, hardware failure, breakage, or mal-position. Deep venous thrombosis (DVT) requiring additional treatment such as filter, and/or medications. Injury to abdominal contents, loss in bowel and bladder control. Additional risks of pedicle screw fixation include malposition, fracture, nonunion, breaking of the screws or rods, CSF leak, ongoing or worsening pain.  We have also discussed the goals of surgery to include: Goals of surgery: Reduction in pain, and improvement in quality of life.  We  have also discussed the post-operative recovery period to include: bathing/showering restrictions, wound healing, activity (and driving) restrictions, medications/pain mangement.  We have also discussed post-operative redflags to include: signs and symptoms of postoperative infection,  DVT/PE.  We have obtained preoperative clearance from his primary care provider.  He is meeting with physical therapy after this appointment for his preoperative physical therapy evaluation and brace fitting.  He is scheduled for his preoperative evaluation at [facility] and his preoperative coronavirus testing on 06/24/2019.  All the patient's questions were invited and answered.  Follow-up: 2 weeks status post surgery

## 2019-06-21 NOTE — H&P (Deleted)
  The note originally documented on this encounter has been moved the the encounter in which it belongs.  

## 2019-06-21 NOTE — Progress Notes (Signed)
[id number] - [place], [place] - [address] [place] [address] Phone: [phone] Fax: [phone]      Your procedure is scheduled on July 30  Report to [facility] Main Entrance "A" at 0530 A.M., and check in at the Admitting office.  Call this number if you have problems the morning of surgery:  [phone]  Call [phone] if you have any questions prior to your surgery date Monday-Friday 8am-4pm    Remember:  Do not eat or drink after midnight the night before your surgery    Take these medicines the morning of surgery with A SIP OF WATER  omeprazole (PRILOSEC) sertraline (ZOLOFT)  7 days prior to surgery STOP taking any Aspirin (unless otherwise instructed by your surgeon), Aleve, Naproxen, Ibuprofen, Motrin, Advil, Goody's, BC's, all herbal medications, fish oil, and all vitamins.    The Morning of Surgery  Do not wear jewelry, make-up or nail polish.  Do not wear lotions, powders, or perfumes/colognes, or deodorant  Do not shave 48 hours prior to surgery.  Men may shave face and neck.  Do not bring valuables to the hospital.  [facility] is not responsible for any belongings or valuables.  If you are a smoker, DO NOT Smoke 24 hours prior to surgery IF you wear a CPAP at night please bring your mask, tubing, and machine the morning of surgery   Remember that you must have someone to transport you home after your surgery, and remain with you for 24 hours if you are discharged the same day.   Contacts, glasses, hearing aids, dentures or bridgework may not be worn into surgery.    Leave your suitcase in the car.  After surgery it may be brought to your room.  For patients admitted to the hospital, discharge time will be determined by your treatment team.  Patients discharged the day of surgery will not be allowed to drive home.    Special instructions:   [place]- Preparing For  Surgery  Before surgery, you can play an important role. Because skin is not sterile, your skin needs to be as free of germs as possible. You can reduce the number of germs on your skin by washing with CHG (chlorahexidine gluconate) Soap before surgery.  CHG is an antiseptic cleaner which kills germs and bonds with the skin to continue killing germs even after washing.    Oral Hygiene is also important to reduce your risk of infection.  Remember - BRUSH YOUR TEETH THE MORNING OF SURGERY WITH YOUR REGULAR TOOTHPASTE  Please do not use if you have an allergy to CHG or antibacterial soaps. If your skin becomes reddened/irritated stop using the CHG.  Do not shave (including legs and underarms) for at least 48 hours prior to first CHG shower. It is OK to shave your face.  Please follow these instructions carefully.   1. Shower the NIGHT BEFORE SURGERY and the MORNING OF SURGERY with CHG Soap.   2. If you chose to wash your hair, wash your hair first as usual with your normal shampoo.  3. After you shampoo, rinse your hair and body thoroughly to remove the shampoo.  4. Use CHG as you would any other liquid soap. You can apply CHG directly to the skin and wash gently with a scrungie or a clean washcloth.   5. Apply the CHG Soap to your body ONLY FROM THE NECK DOWN.  Do not use on open wounds or open sores. Avoid contact with your eyes, ears, mouth and genitals (private parts). Wash Face and genitals (private parts)  with your normal soap.   6. Wash thoroughly, paying special attention to the area where your surgery will be performed.  7. Thoroughly rinse your body with warm water from the neck down.  8. DO NOT shower/wash with your normal soap after using and rinsing off the CHG Soap.  9. Pat yourself dry with a CLEAN TOWEL.  10. Wear CLEAN PAJAMAS to bed the night before surgery, wear comfortable clothes the morning of surgery  11. Place CLEAN SHEETS on your bed the night of your first  shower and DO NOT SLEEP WITH PETS.    Day of Surgery:  Do not apply any deodorants/lotions. Please shower the morning of surgery with the CHG soap  Please wear clean clothes to the hospital/surgery center.   Remember to brush your teeth WITH YOUR REGULAR TOOTHPASTE.   Please read over the following fact sheets that you were given.

## 2019-06-24 ENCOUNTER — Encounter (HOSPITAL_COMMUNITY): Payer: Self-pay

## 2019-06-24 ENCOUNTER — Other Ambulatory Visit (HOSPITAL_COMMUNITY)
Admission: RE | Admit: 2019-06-24 | Discharge: 2019-06-24 | Disposition: A | Discharge status: Home / Self Care | Payer: 59 | Source: Ambulatory Visit | Attending: Orthopedic Surgery | Admitting: Orthopedic Surgery

## 2019-06-24 ENCOUNTER — Other Ambulatory Visit: Payer: Self-pay

## 2019-06-24 ENCOUNTER — Encounter (HOSPITAL_COMMUNITY)
Admission: RE | Admit: 2019-06-24 | Discharge: 2019-06-24 | Disposition: A | Discharge status: Home / Self Care | Payer: 59 | Source: Ambulatory Visit | Attending: Orthopedic Surgery | Admitting: Orthopedic Surgery

## 2019-06-24 ENCOUNTER — Ambulatory Visit (HOSPITAL_COMMUNITY)
Admission: RE | Admit: 2019-06-24 | Discharge: 2019-06-24 | Disposition: A | Discharge status: Home / Self Care | Payer: 59 | Source: Ambulatory Visit | Attending: Orthopedic Surgery | Admitting: Orthopedic Surgery

## 2019-06-24 DIAGNOSIS — Z01818 Encounter for other preprocedural examination: Secondary | ICD-10-CM

## 2019-06-24 HISTORY — DX: Personal history of urinary calculi: Z87.442

## 2019-06-24 LAB — CBC
HCT: 49.3 % (ref 39.0–52.0)
Hemoglobin: 16.7 g/dL (ref 13.0–17.0)
MCH: 30.4 pg (ref 26.0–34.0)
MCHC: 33.9 g/dL (ref 30.0–36.0)
MCV: 89.6 fL (ref 80.0–100.0)
Platelets: 364 10*3/uL (ref 150–400)
RBC: 5.5 MIL/uL (ref 4.22–5.81)
RDW: 12.9 % (ref 11.5–15.5)
WBC: 9.3 10*3/uL (ref 4.0–10.5)
nRBC: 0 % (ref 0.0–0.2)

## 2019-06-24 LAB — BASIC METABOLIC PANEL
Anion gap: 10 (ref 5–15)
BUN: 13 mg/dL (ref 6–20)
CO2: 22 mmol/L (ref 22–32)
Calcium: 9.3 mg/dL (ref 8.9–10.3)
Chloride: 107 mmol/L (ref 98–111)
Creatinine, Ser: 0.91 mg/dL (ref 0.61–1.24)
GFR calc Af Amer: >60 mL/min (ref >60)
GFR calc non Af Amer: >60 mL/min (ref >60)
Glucose, Bld: 96 mg/dL (ref 70–99)
Potassium: 4.3 mmol/L (ref 3.5–5.1)
Sodium: 139 mmol/L (ref 135–145)

## 2019-06-24 LAB — ABO/RH: ABO/RH(D): A POS

## 2019-06-24 LAB — TYPE AND SCREEN
ABO/RH(D): A POS
Antibody Screen: NEGATIVE

## 2019-06-24 LAB — URINALYSIS, ROUTINE W REFLEX MICROSCOPIC
Bilirubin Urine: NEGATIVE
Glucose, UA: NEGATIVE mg/dL
Hgb urine dipstick: NEGATIVE
Ketones, ur: NEGATIVE mg/dL
Leukocytes,Ua: NEGATIVE
Nitrite: NEGATIVE
Protein, ur: NEGATIVE mg/dL
Specific Gravity, Urine: 1.005 (ref 1.005–1.030)
pH: 6 (ref 5.0–8.0)

## 2019-06-24 LAB — PROTIME-INR
INR: 0.9 (ref 0.8–1.2)
Prothrombin Time: 12.4 seconds (ref 11.4–15.2)

## 2019-06-24 LAB — SURGICAL PCR SCREEN
MRSA, PCR: NEGATIVE
Staphylococcus aureus: NEGATIVE

## 2019-06-24 LAB — SARS CORONAVIRUS 2 (TAT 6-24 HRS): SARS Coronavirus 2: NEGATIVE

## 2019-06-24 LAB — APTT: aPTT: 27 seconds (ref 24–36)

## 2019-06-24 NOTE — Progress Notes (Signed)
PCP - [place] [facility] Cardiologist - denies  Chest x-ray - 06/24/19 EKG - requesting Stress Test - denies ECHO - denies Cardiac Cath - denies   Anesthesia review: NO  Patient denies shortness of breath, fever, cough and chest pain at PAT appointment   Patient verbalized understanding of instructions that were given to them at the PAT appointment. Patient was also instructed that they will need to review over the PAT instructions again at home before surgery.

## 2019-06-26 NOTE — Anesthesia Preprocedure Evaluation (Addendum)
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  Reproductive/Obstetrics                           Anesthesia Physical Anesthesia Plan  ASA: II  Anesthesia Plan: General   Post-op Pain Management:    Induction: Intravenous  PONV Risk Score and Plan: 2 and TIVA, Midazolam, Diphenhydramine, Dexamethasone and Ondansetron  Airway Management Planned: Oral ETT  Additional Equipment:   Intra-op Plan:   Post-operative Plan: Extubation in OR  Informed Consent: I have reviewed the patients History and Physical, chart, labs and discussed the procedure including the risks, benefits and alternatives for the proposed anesthesia with the patient or authorized representative who has indicated his/her understanding and acceptance.     Dental advisory given  Plan Discussed with: CRNA  Anesthesia Plan Comments: (TIVA for neuro-monitoring. 2nd large bore PIV.)       Anesthesia Quick Evaluation

## 2019-06-27 ENCOUNTER — Inpatient Hospital Stay (HOSPITAL_COMMUNITY)
Admission: RE | Admit: 2019-06-27 | Discharge: 2019-06-28 | DRG: 460 | Disposition: A | Discharge status: Home / Self Care | Payer: 59 | Attending: Orthopedic Surgery | Admitting: Orthopedic Surgery

## 2019-06-27 ENCOUNTER — Other Ambulatory Visit: Payer: Self-pay

## 2019-06-27 ENCOUNTER — Encounter (HOSPITAL_COMMUNITY): Payer: Self-pay

## 2019-06-27 ENCOUNTER — Encounter (HOSPITAL_COMMUNITY)
Admission: RE | Disposition: A | Discharge status: Home / Self Care | Payer: Self-pay | Source: Home / Self Care | Attending: Orthopedic Surgery

## 2019-06-27 ENCOUNTER — Inpatient Hospital Stay (HOSPITAL_COMMUNITY): Payer: 59

## 2019-06-27 ENCOUNTER — Inpatient Hospital Stay (HOSPITAL_COMMUNITY): Payer: 59 | Admitting: Certified Registered Nurse Anesthetist

## 2019-06-27 ENCOUNTER — Inpatient Hospital Stay (HOSPITAL_COMMUNITY): Payer: 59 | Admitting: Physician Assistant

## 2019-06-27 DIAGNOSIS — Z85828 Personal history of other malignant neoplasm of skin: Secondary | ICD-10-CM | POA: Diagnosis not present

## 2019-06-27 DIAGNOSIS — M48061 Spinal stenosis, lumbar region without neurogenic claudication: Principal | ICD-10-CM | POA: Diagnosis present

## 2019-06-27 DIAGNOSIS — E669 Obesity, unspecified: Secondary | ICD-10-CM | POA: Diagnosis present

## 2019-06-27 DIAGNOSIS — K219 Gastro-esophageal reflux disease without esophagitis: Secondary | ICD-10-CM | POA: Diagnosis present

## 2019-06-27 DIAGNOSIS — Z79899 Other long term (current) drug therapy: Secondary | ICD-10-CM | POA: Diagnosis not present

## 2019-06-27 DIAGNOSIS — F17210 Nicotine dependence, cigarettes, uncomplicated: Secondary | ICD-10-CM | POA: Diagnosis present

## 2019-06-27 DIAGNOSIS — Z6830 Body mass index (BMI) 30.0-30.9, adult: Secondary | ICD-10-CM | POA: Diagnosis not present

## 2019-06-27 DIAGNOSIS — E7800 Pure hypercholesterolemia, unspecified: Secondary | ICD-10-CM | POA: Diagnosis present

## 2019-06-27 DIAGNOSIS — M961 Postlaminectomy syndrome, not elsewhere classified: Secondary | ICD-10-CM | POA: Diagnosis present

## 2019-06-27 DIAGNOSIS — Z419 Encounter for procedure for purposes other than remedying health state, unspecified: Secondary | ICD-10-CM

## 2019-06-27 DIAGNOSIS — I10 Essential (primary) hypertension: Secondary | ICD-10-CM | POA: Diagnosis present

## 2019-06-27 DIAGNOSIS — M5136 Other intervertebral disc degeneration, lumbar region: Secondary | ICD-10-CM | POA: Diagnosis present

## 2019-06-27 DIAGNOSIS — M4327 Fusion of spine, lumbosacral region: Secondary | ICD-10-CM | POA: Diagnosis present

## 2019-06-27 DIAGNOSIS — M545 Low back pain: Secondary | ICD-10-CM | POA: Diagnosis present

## 2019-06-27 DIAGNOSIS — Z20828 Contact with and (suspected) exposure to other viral communicable diseases: Secondary | ICD-10-CM | POA: Diagnosis present

## 2019-06-27 HISTORY — PX: ANTERIOR LAT LUMBAR FUSION: SHX1168

## 2019-06-27 SURGERY — ANTERIOR LATERAL LUMBAR FUSION 1 LEVEL
Anesthesia: General

## 2019-06-27 MED ORDER — PHENOL 1.4 % MT LIQD: 1.0000 | OROMUCOSAL | Status: DC | PRN

## 2019-06-27 MED ORDER — GLYCOPYRROLATE 0.2 MG/ML IJ SOLN
INTRAMUSCULAR | Status: DC | PRN
  Administered 2019-06-27: 0.2 mg via INTRAVENOUS

## 2019-06-27 MED ORDER — SODIUM CHLORIDE 0.9% FLUSH: 3.0000 mL | INTRAVENOUS | Status: DC | PRN

## 2019-06-27 MED ORDER — SERTRALINE HCL 50 MG PO TABS
50.0000 mg | ORAL_TABLET | Freq: Every day | ORAL | Status: DC
  Administered 2019-06-28: 10:00:00 50 mg via ORAL
  Filled 2019-06-27: qty 1

## 2019-06-27 MED ORDER — GABAPENTIN 300 MG PO CAPS: 300.0000 mg | ORAL_CAPSULE | Freq: Two times a day (BID) | ORAL | Status: DC

## 2019-06-27 MED ORDER — PROPOFOL 500 MG/50ML IV EMUL
INTRAVENOUS | Status: DC | PRN
  Administered 2019-06-27: 50 ug/kg/min via INTRAVENOUS

## 2019-06-27 MED ORDER — TRANEXAMIC ACID-NACL 1000-0.7 MG/100ML-% IV SOLN
INTRAVENOUS | Status: AC
  Filled 2019-06-27: qty 100

## 2019-06-27 MED ORDER — KETAMINE HCL 50 MG/5ML IJ SOSY
PREFILLED_SYRINGE | INTRAMUSCULAR | Status: AC
  Filled 2019-06-27: qty 5

## 2019-06-27 MED ORDER — SURGIFOAM 100 EX MISC
CUTANEOUS | Status: DC | PRN
  Administered 2019-06-27: 1

## 2019-06-27 MED ORDER — MENTHOL 3 MG MT LOZG: 1.0000 | LOZENGE | OROMUCOSAL | Status: DC | PRN

## 2019-06-27 MED ORDER — 0.9 % SODIUM CHLORIDE (POUR BTL) OPTIME
TOPICAL | Status: DC | PRN
  Administered 2019-06-27: 1000 mL

## 2019-06-27 MED ORDER — ACETAMINOPHEN 10 MG/ML IV SOLN
INTRAVENOUS | Status: DC | PRN
  Administered 2019-06-27: 1000 mg via INTRAVENOUS

## 2019-06-27 MED ORDER — ALBUTEROL SULFATE HFA 108 (90 BASE) MCG/ACT IN AERS
INHALATION_SPRAY | RESPIRATORY_TRACT | Status: DC | PRN
  Administered 2019-06-27: 4 via RESPIRATORY_TRACT

## 2019-06-27 MED ORDER — SUCCINYLCHOLINE CHLORIDE 200 MG/10ML IV SOSY
PREFILLED_SYRINGE | INTRAVENOUS | Status: AC
  Filled 2019-06-27: qty 10

## 2019-06-27 MED ORDER — PROPOFOL 1000 MG/100ML IV EMUL
INTRAVENOUS | Status: AC
  Filled 2019-06-27: qty 400

## 2019-06-27 MED ORDER — DEXAMETHASONE SODIUM PHOSPHATE 10 MG/ML IJ SOLN
INTRAMUSCULAR | Status: DC | PRN
  Administered 2019-06-27: 10 mg via INTRAVENOUS

## 2019-06-27 MED ORDER — THROMBIN (RECOMBINANT) 20000 UNITS EX SOLR
CUTANEOUS | Status: AC
  Filled 2019-06-27: qty 20000

## 2019-06-27 MED ORDER — HYDROMORPHONE HCL 1 MG/ML IJ SOLN
0.5000 mg | INTRAMUSCULAR | Status: AC | PRN
  Administered 2019-06-27 (×2): 0.5 mg via INTRAVENOUS
  Filled 2019-06-27 (×2): qty 0.5

## 2019-06-27 MED ORDER — METHOCARBAMOL 500 MG PO TABS: 500.0000 mg | ORAL_TABLET | Freq: Three times a day (TID) | ORAL | 0 refills | Status: AC | PRN

## 2019-06-27 MED ORDER — OXYCODONE HCL 5 MG PO TABS
5.0000 mg | ORAL_TABLET | ORAL | Status: DC | PRN
  Administered 2019-06-27: 11:00:00 5 mg via ORAL

## 2019-06-27 MED ORDER — EPHEDRINE 5 MG/ML INJ
INTRAVENOUS | Status: AC
  Filled 2019-06-27: qty 20

## 2019-06-27 MED ORDER — LIDOCAINE HCL (CARDIAC) PF 100 MG/5ML IV SOSY
PREFILLED_SYRINGE | INTRAVENOUS | Status: DC | PRN
  Administered 2019-06-27: 100 mg via INTRATRACHEAL

## 2019-06-27 MED ORDER — PROPOFOL 10 MG/ML IV BOLUS
INTRAVENOUS | Status: DC | PRN
  Administered 2019-06-27: 100 mg via INTRAVENOUS
  Administered 2019-06-27: 50 mg via INTRAVENOUS
  Administered 2019-06-27: 100 mg via INTRAVENOUS
  Administered 2019-06-27: 50 mg via INTRAVENOUS

## 2019-06-27 MED ORDER — DIPHENHYDRAMINE HCL 50 MG/ML IJ SOLN
INTRAMUSCULAR | Status: DC | PRN
  Administered 2019-06-27: 12.5 mg via INTRAVENOUS

## 2019-06-27 MED ORDER — POLYETHYLENE GLYCOL 3350 17 G PO PACK: 17.0000 g | PACK | Freq: Every day | ORAL | Status: DC | PRN

## 2019-06-27 MED ORDER — ONDANSETRON HCL 4 MG/2ML IJ SOLN: 4.0000 mg | Freq: Four times a day (QID) | INTRAMUSCULAR | Status: DC | PRN

## 2019-06-27 MED ORDER — ALBUTEROL SULFATE HFA 108 (90 BASE) MCG/ACT IN AERS
INHALATION_SPRAY | RESPIRATORY_TRACT | Status: AC
  Filled 2019-06-27: qty 6.7

## 2019-06-27 MED ORDER — ONDANSETRON HCL 4 MG PO TABS: 4.0000 mg | ORAL_TABLET | Freq: Three times a day (TID) | ORAL | 0 refills | Status: DC | PRN

## 2019-06-27 MED ORDER — MIDAZOLAM HCL 2 MG/2ML IJ SOLN
INTRAMUSCULAR | Status: AC
  Filled 2019-06-27: qty 2

## 2019-06-27 MED ORDER — SUCCINYLCHOLINE CHLORIDE 20 MG/ML IJ SOLN
INTRAMUSCULAR | Status: DC | PRN
  Administered 2019-06-27: 120 mg via INTRAVENOUS

## 2019-06-27 MED ORDER — OXYCODONE-ACETAMINOPHEN 10-325 MG PO TABS: 1.0000 | ORAL_TABLET | Freq: Four times a day (QID) | ORAL | 0 refills | Status: AC | PRN

## 2019-06-27 MED ORDER — KETAMINE HCL 10 MG/ML IJ SOLN
INTRAMUSCULAR | Status: DC | PRN
  Administered 2019-06-27 (×3): 10 mg via INTRAVENOUS
  Administered 2019-06-27: 20 mg via INTRAVENOUS

## 2019-06-27 MED ORDER — ACETAMINOPHEN 10 MG/ML IV SOLN
INTRAVENOUS | Status: AC
  Filled 2019-06-27: qty 100

## 2019-06-27 MED ORDER — CEFAZOLIN SODIUM-DEXTROSE 1-4 GM/50ML-% IV SOLN
1.0000 g | Freq: Three times a day (TID) | INTRAVENOUS | Status: AC
  Administered 2019-06-27 (×2): 1 g via INTRAVENOUS
  Filled 2019-06-27 (×2): qty 50

## 2019-06-27 MED ORDER — PROMETHAZINE HCL 25 MG/ML IJ SOLN: 6.2500 mg | INTRAMUSCULAR | Status: DC | PRN

## 2019-06-27 MED ORDER — ACETAMINOPHEN 325 MG PO TABS
650.0000 mg | ORAL_TABLET | ORAL | Status: DC | PRN
  Administered 2019-06-27 – 2019-06-28 (×3): 650 mg via ORAL
  Filled 2019-06-27 (×4): qty 2

## 2019-06-27 MED ORDER — DEXAMETHASONE SODIUM PHOSPHATE 10 MG/ML IJ SOLN
INTRAMUSCULAR | Status: AC
  Filled 2019-06-27: qty 1

## 2019-06-27 MED ORDER — ONDANSETRON HCL 4 MG/2ML IJ SOLN
INTRAMUSCULAR | Status: AC
  Filled 2019-06-27: qty 2

## 2019-06-27 MED ORDER — EPHEDRINE SULFATE 50 MG/ML IJ SOLN
INTRAMUSCULAR | Status: DC | PRN
  Administered 2019-06-27 (×2): 25 mg via INTRAVENOUS

## 2019-06-27 MED ORDER — FENTANYL CITRATE (PF) 100 MCG/2ML IJ SOLN
INTRAMUSCULAR | Status: AC
  Filled 2019-06-27: qty 2

## 2019-06-27 MED ORDER — DOCUSATE SODIUM 100 MG PO CAPS
100.0000 mg | ORAL_CAPSULE | Freq: Two times a day (BID) | ORAL | Status: DC
  Administered 2019-06-27 – 2019-06-28 (×3): 100 mg via ORAL
  Filled 2019-06-27 (×3): qty 1

## 2019-06-27 MED ORDER — LACTATED RINGERS IV SOLN
INTRAVENOUS | Status: DC | PRN
  Administered 2019-06-27 (×2): via INTRAVENOUS

## 2019-06-27 MED ORDER — LACTATED RINGERS IV SOLN: INTRAVENOUS | Status: DC

## 2019-06-27 MED ORDER — STERILE WATER FOR IRRIGATION IR SOLN
Status: DC | PRN
  Administered 2019-06-27: 1000 mL

## 2019-06-27 MED ORDER — GABAPENTIN 300 MG PO CAPS
300.0000 mg | ORAL_CAPSULE | Freq: Three times a day (TID) | ORAL | Status: DC
  Administered 2019-06-27 – 2019-06-28 (×2): 300 mg via ORAL
  Filled 2019-06-27 (×2): qty 1

## 2019-06-27 MED ORDER — METHOCARBAMOL 500 MG PO TABS
ORAL_TABLET | ORAL | Status: AC
  Filled 2019-06-27: qty 1

## 2019-06-27 MED ORDER — CEFAZOLIN SODIUM-DEXTROSE 2-4 GM/100ML-% IV SOLN
INTRAVENOUS | Status: AC
  Filled 2019-06-27: qty 100

## 2019-06-27 MED ORDER — FENTANYL CITRATE (PF) 100 MCG/2ML IJ SOLN
25.0000 ug | INTRAMUSCULAR | Status: DC | PRN
  Administered 2019-06-27 (×2): 50 ug via INTRAVENOUS

## 2019-06-27 MED ORDER — LACTATED RINGERS IV SOLN
INTRAVENOUS | Status: DC | PRN
  Administered 2019-06-27: 07:00:00 via INTRAVENOUS

## 2019-06-27 MED ORDER — FENTANYL CITRATE (PF) 250 MCG/5ML IJ SOLN
INTRAMUSCULAR | Status: AC
  Filled 2019-06-27: qty 5

## 2019-06-27 MED ORDER — OXYCODONE HCL 5 MG PO TABS
ORAL_TABLET | ORAL | Status: AC
  Filled 2019-06-27: qty 1

## 2019-06-27 MED ORDER — TRANEXAMIC ACID-NACL 1000-0.7 MG/100ML-% IV SOLN
INTRAVENOUS | Status: DC | PRN
  Administered 2019-06-27: 1000 mg via INTRAVENOUS

## 2019-06-27 MED ORDER — FENTANYL CITRATE (PF) 250 MCG/5ML IJ SOLN
INTRAMUSCULAR | Status: DC | PRN
  Administered 2019-06-27 (×3): 50 ug via INTRAVENOUS
  Administered 2019-06-27: 100 ug via INTRAVENOUS

## 2019-06-27 MED ORDER — BUPIVACAINE-EPINEPHRINE (PF) 0.25% -1:200000 IJ SOLN
INTRAMUSCULAR | Status: AC
  Filled 2019-06-27: qty 30

## 2019-06-27 MED ORDER — LIDOCAINE 2% (20 MG/ML) 5 ML SYRINGE
INTRAMUSCULAR | Status: AC
  Filled 2019-06-27: qty 5

## 2019-06-27 MED ORDER — MORPHINE SULFATE (PF) 2 MG/ML IV SOLN
2.0000 mg | INTRAVENOUS | Status: DC | PRN
  Administered 2019-06-27: 2 mg via INTRAVENOUS
  Filled 2019-06-27: qty 1

## 2019-06-27 MED ORDER — PROPOFOL 10 MG/ML IV BOLUS
INTRAVENOUS | Status: AC
  Filled 2019-06-27: qty 40

## 2019-06-27 MED ORDER — PHENYLEPHRINE 40 MCG/ML (10ML) SYRINGE FOR IV PUSH (FOR BLOOD PRESSURE SUPPORT)
PREFILLED_SYRINGE | INTRAVENOUS | Status: AC
  Filled 2019-06-27: qty 10

## 2019-06-27 MED ORDER — ONDANSETRON HCL 4 MG PO TABS: 4.0000 mg | ORAL_TABLET | Freq: Four times a day (QID) | ORAL | Status: DC | PRN

## 2019-06-27 MED ORDER — METHOCARBAMOL 500 MG PO TABS
500.0000 mg | ORAL_TABLET | Freq: Four times a day (QID) | ORAL | Status: DC | PRN
  Administered 2019-06-27 – 2019-06-28 (×3): 500 mg via ORAL
  Filled 2019-06-27 (×3): qty 1

## 2019-06-27 MED ORDER — BUPIVACAINE-EPINEPHRINE 0.25% -1:200000 IJ SOLN
INTRAMUSCULAR | Status: DC | PRN
  Administered 2019-06-27: 10 mL

## 2019-06-27 MED ORDER — OXYCODONE HCL 5 MG PO TABS
10.0000 mg | ORAL_TABLET | ORAL | Status: DC | PRN
  Administered 2019-06-27 – 2019-06-28 (×6): 10 mg via ORAL
  Filled 2019-06-27 (×6): qty 2

## 2019-06-27 MED ORDER — SODIUM CHLORIDE 0.9% FLUSH
3.0000 mL | Freq: Two times a day (BID) | INTRAVENOUS | Status: DC
  Administered 2019-06-27: 3 mL via INTRAVENOUS

## 2019-06-27 MED ORDER — PANTOPRAZOLE SODIUM 40 MG PO TBEC
40.0000 mg | DELAYED_RELEASE_TABLET | Freq: Every day | ORAL | Status: DC
  Administered 2019-06-27 – 2019-06-28 (×2): 40 mg via ORAL
  Filled 2019-06-27: qty 1

## 2019-06-27 MED ORDER — CEFAZOLIN SODIUM-DEXTROSE 2-4 GM/100ML-% IV SOLN
2.0000 g | INTRAVENOUS | Status: AC
  Administered 2019-06-27: 2 g via INTRAVENOUS

## 2019-06-27 MED ORDER — METHOCARBAMOL 1000 MG/10ML IJ SOLN
500.0000 mg | Freq: Four times a day (QID) | INTRAVENOUS | Status: DC | PRN
  Administered 2019-06-27: 21:00:00 500 mg via INTRAVENOUS
  Filled 2019-06-27 (×2): qty 5

## 2019-06-27 MED ORDER — GABAPENTIN 300 MG PO CAPS: 300.0000 mg | ORAL_CAPSULE | Freq: Every day | ORAL | Status: DC

## 2019-06-27 MED ORDER — SODIUM CHLORIDE 0.9 % IV SOLN
INTRAVENOUS | Status: DC | PRN
  Administered 2019-06-27: 50 ug/min via INTRAVENOUS

## 2019-06-27 MED ORDER — HEMOSTATIC AGENTS (NO CHARGE) OPTIME
TOPICAL | Status: DC | PRN
  Administered 2019-06-27: 1 via TOPICAL

## 2019-06-27 MED ORDER — ACETAMINOPHEN 650 MG RE SUPP: 650.0000 mg | RECTAL | Status: DC | PRN

## 2019-06-27 SURGICAL SUPPLY — 88 items
AGENT HMST KT MTR STRL THRMB (HEMOSTASIS) ×1
BLADE CLIPPER SURG (BLADE) IMPLANT
BLADE SURG 10 STRL SS (BLADE) ×3 IMPLANT
BOLT PLATE XLIF 5.5X55 LRG (Bolt) ×1 IMPLANT
BOLT PLATE XLIF 5.5X55MM LRG (Bolt) ×1 IMPLANT
BOLT PLATE XLIF 5.5X60 LRG (Bolt) ×1 IMPLANT
BOLT PLATE XLIF 5.5X60MM LRG (Bolt) ×1 IMPLANT
BONE MATRIX OSTEOCEL PRO MED (Bone Implant) ×2 IMPLANT
BUR EGG ELITE 4.0 (BURR) IMPLANT
BUR EGG ELITE 4.0MM (BURR)
CABLE BIPOLOR RESECTION CORD (MISCELLANEOUS) ×1 IMPLANT
CAGE COROENT XLW TI 12X22X55 (Cage) ×2 IMPLANT
CANISTER SUCT 3000ML PPV (MISCELLANEOUS) ×3 IMPLANT
CATH FOLEY 2WAY SLVR  5CC 16FR (CATHETERS) ×2
CATH FOLEY 2WAY SLVR 5CC 16FR (CATHETERS) ×1 IMPLANT
CLOSURE STERI-STRIP 1/2X4 (GAUZE/BANDAGES/DRESSINGS) ×1
CLSR STERI-STRIP ANTIMIC 1/2X4 (GAUZE/BANDAGES/DRESSINGS) ×2 IMPLANT
COVER SURGICAL LIGHT HANDLE (MISCELLANEOUS) ×4 IMPLANT
COVER WAND RF STERILE (DRAPES) ×2 IMPLANT
DRAPE C-ARM 42X72 X-RAY (DRAPES) ×4 IMPLANT
DRAPE C-ARMOR (DRAPES) ×4 IMPLANT
DRAPE INCISE IOBAN 66X45 STRL (DRAPES) IMPLANT
DRAPE POUCH INSTRU U-SHP 10X18 (DRAPES) ×3 IMPLANT
DRAPE SURG 17X23 STRL (DRAPES) ×1 IMPLANT
DRAPE U-SHAPE 47X51 STRL (DRAPES) ×3 IMPLANT
DRSG OPSITE POSTOP 4X6 (GAUZE/BANDAGES/DRESSINGS) ×3 IMPLANT
DRSG OPSITE POSTOP 4X8 (GAUZE/BANDAGES/DRESSINGS) ×3 IMPLANT
DURAPREP 26ML APPLICATOR (WOUND CARE) ×4 IMPLANT
ELECT BLADE 4.0 EZ CLEAN MEGAD (MISCELLANEOUS) ×3
ELECT BLADE 6.5 EXT (BLADE) IMPLANT
ELECT PENCIL ROCKER SW 15FT (MISCELLANEOUS) ×2 IMPLANT
ELECT REM PT RETURN 9FT ADLT (ELECTROSURGICAL) ×3
ELECTRODE BLDE 4.0 EZ CLN MEGD (MISCELLANEOUS) ×2 IMPLANT
ELECTRODE REM PT RTRN 9FT ADLT (ELECTROSURGICAL) ×2 IMPLANT
GLOVE BIO SURGEON STRL SZ 6.5 (GLOVE) ×2 IMPLANT
GLOVE BIO SURGEONS STRL SZ 6.5 (GLOVE) ×1
GLOVE BIOGEL PI IND STRL 6.5 (GLOVE) ×1 IMPLANT
GLOVE BIOGEL PI IND STRL 8.5 (GLOVE) ×2 IMPLANT
GLOVE BIOGEL PI INDICATOR 6.5 (GLOVE) ×2
GLOVE BIOGEL PI INDICATOR 8.5 (GLOVE) ×2
GLOVE SS BIOGEL STRL SZ 8.5 (GLOVE) ×2 IMPLANT
GLOVE SUPERSENSE BIOGEL SZ 8.5 (GLOVE) ×4
GOWN STRL REUS W/ TWL LRG LVL3 (GOWN DISPOSABLE) ×3 IMPLANT
GOWN STRL REUS W/ TWL XL LVL3 (GOWN DISPOSABLE) ×2 IMPLANT
GOWN STRL REUS W/TWL 2XL LVL3 (GOWN DISPOSABLE) ×6 IMPLANT
GOWN STRL REUS W/TWL LRG LVL3 (GOWN DISPOSABLE) ×6
GOWN STRL REUS W/TWL XL LVL3 (GOWN DISPOSABLE)
KIT BASIN OR (CUSTOM PROCEDURE TRAY) ×4 IMPLANT
KIT DILATOR XLIF 5 (KITS) ×1 IMPLANT
KIT POSITION SURG JACKSON T1 (MISCELLANEOUS) IMPLANT
KIT SURGICAL ACCESS MAXCESS 4 (KITS) ×2 IMPLANT
KIT TURNOVER KIT B (KITS) ×4 IMPLANT
KIT XLIF (KITS) ×1
MODULE EMG NDL SSEP NVM5 (NEEDLE) IMPLANT
MODULE EMG NEEDLE SSEP NVM5 (NEEDLE) ×3 IMPLANT
MODULE NVM5 NEXT GEN EMG (NEEDLE) ×2 IMPLANT
NDL SPNL 18GX3.5 QUINCKE PK (NEEDLE) ×3 IMPLANT
NEEDLE 22X1 1/2 (OR ONLY) (NEEDLE) ×6 IMPLANT
NEEDLE SPNL 18GX3.5 QUINCKE PK (NEEDLE) ×6 IMPLANT
NS IRRIG 1000ML POUR BTL (IV SOLUTION) ×6 IMPLANT
PACK LAMINECTOMY ORTHO (CUSTOM PROCEDURE TRAY) ×4 IMPLANT
PACK UNIVERSAL I (CUSTOM PROCEDURE TRAY) ×4 IMPLANT
PAD ARMBOARD 7.5X6 YLW CONV (MISCELLANEOUS) ×12 IMPLANT
PATTIES SURGICAL .5 X.5 (GAUZE/BANDAGES/DRESSINGS) IMPLANT
PATTIES SURGICAL .5 X1 (DISPOSABLE) ×1 IMPLANT
PIN FIXATION XLIF DECADE (PIN) ×2 IMPLANT
PLATE DECADE XLIP 2H SZ12 (Plate) ×2 IMPLANT
POSITIONER HEAD PRONE TRACH (MISCELLANEOUS) ×1 IMPLANT
PUTTY DBX 1CC (Putty) ×3 IMPLANT
PUTTY DBX 1CC DEPUY (Putty) IMPLANT
SPONGE LAP 4X18 RFD (DISPOSABLE) ×5 IMPLANT
SPONGE SURGIFOAM ABS GEL 100 (HEMOSTASIS) ×4 IMPLANT
STAPLER VISISTAT 35W (STAPLE) ×1 IMPLANT
SURGIFLO W/THROMBIN 8M KIT (HEMOSTASIS) ×2 IMPLANT
SUT BONE WAX W31G (SUTURE) ×3 IMPLANT
SUT MON AB 3-0 SH 27 (SUTURE) ×3
SUT MON AB 3-0 SH27 (SUTURE) ×4 IMPLANT
SUT VIC AB 1 CT1 18XCR BRD 8 (SUTURE) ×3 IMPLANT
SUT VIC AB 1 CT1 8-18 (SUTURE) ×3
SUT VIC AB 2-0 CT1 18 (SUTURE) ×5 IMPLANT
SYR BULB IRRIGATION 50ML (SYRINGE) ×6 IMPLANT
SYR CONTROL 10ML LL (SYRINGE) ×3 IMPLANT
TAPE CLOTH 4X10 WHT NS (GAUZE/BANDAGES/DRESSINGS) ×5 IMPLANT
TOWEL GREEN STERILE (TOWEL DISPOSABLE) ×4 IMPLANT
TOWEL GREEN STERILE FF (TOWEL DISPOSABLE) ×4 IMPLANT
TRAY FOLEY MTR SLVR 16FR STAT (SET/KITS/TRAYS/PACK) ×3 IMPLANT
WATER STERILE IRR 1000ML POUR (IV SOLUTION) ×4 IMPLANT
YANKAUER SUCT BULB TIP NO VENT (SUCTIONS) ×3 IMPLANT

## 2019-06-27 NOTE — Anesthesia Procedure Notes (Signed)
Procedure Name: Intubation Date/Time: 06/27/2019 7:44 AM Performed by: Scheryl Darter, CRNA Pre-anesthesia Checklist: Patient identified, Emergency Drugs available, Suction available and Patient being monitored Patient Re-evaluated:Patient Re-evaluated prior to induction Oxygen Delivery Method: Circle System Utilized Preoxygenation: Pre-oxygenation with 100% oxygen Induction Type: IV induction Ventilation: Mask ventilation without difficulty Laryngoscope Size: Miller and 3 Grade View: Grade I Tube type: Oral Tube size: 7.5 mm Number of attempts: 1 Airway Equipment and Method: Stylet and Oral airway Placement Confirmation: ETT inserted through vocal cords under direct vision,  positive ETCO2 and breath sounds checked- equal and bilateral Secured at: 23 cm Tube secured with: Tape Dental Injury: Teeth and Oropharynx as per pre-operative assessment

## 2019-06-27 NOTE — Op Note (Signed)
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  Status post prior L3-4 decompression.  Postoperative diagnosis: Same  Operative procedure: Lateral interbody fusion and discectomy L3-4.  Application of lateral plate for supplemental fixation.  First Assistant: Cleta Alberts, PA  Complications: None  Neuro monitoring: No adverse free running EMG or SSEP activity throughout the case.  Implants: NuVasive titanium intervertebral cage.  12 x 22 x 55 (12 parallel implant).  Lateral plate: 12 mm.  Affixed with 55 mm length screw into L3 and 60 mm screw into L4.  Allograft: Osteo-cell and DBX.  Indications: Tristan Wood is a very pleasant 53 year old gentleman who 2 years ago had a foraminal disc herniation underwent a L3-4 foraminotomy and discectomy.  Patient did quite well from this but unfortunately has developed increasing pain.  He now complains of more back buttock and increasing recurrent neuropathic left leg pain.  Imaging does demonstrate recurrent foraminal and lateral recess stenosis due to degenerative disc disease at L3-4.  Patient also has mild degenerative changes at L4-5 and slightly more advanced degenerative changes at L5-S1.  At this point time based on his clinical complaints I felt as though the L3-4 recurrent stenosis was the primary cause of his neuropathic leg pain and back pain.  After long discussion with the patient and his wife we elected to address the L3-4 pathology.  My hope was to avoid having to do a multilevel procedure.  Patient and I discussed an interbody fixation with supplemental posterior pedicle screw fixation.  I also told him that we may be able to apply a lateral plate which would provide similar stability and that the decision will be made intraoperatively.  The patient expressed an understanding of the surgical procedure as well as the risks benefits and alternatives.  Consent was obtained.  Operative  report: Patient was brought the operating room on a stretcher.  After successful induction of general anesthesia and endotracheal the patient teds SCDs and a Foley were inserted.  The neuro monitoring representative then applied all appropriate pads and needles for intraoperative SSEP and EMG monitoring.  Patient was then transferred to the operating room table and then placed into the left lateral decubitus position (left side up).  Axillary roll was properly positioned and all bony prominences were well-padded and the knees were gently flexed.  Using fluoroscopy I made sure the patient was properly positioned on the table so that the L3-4 disc space was perpendicular to the table.  Once I confirmed satisfactory positioning of the patient he was secured to the table with tape.  The lateral aspect of the abdomen as well as the abdomen and lumbar spine were prepped and draped in a standard fashion.  Timeout was taken to confirm patient procedure and all other important data.  X-ray was used to identify the anterior and posterior margins of the L3-4 disc space they were marked out in this area was infiltrated with quarter percent Marcaine with epinephrine.  Transverse lateral incision was made and I sharply dissected down through the deep fascia to expose the fascia of the external oblique.  Approximately 1 fingerbreadth posteriorly I made another small incision.  I used my finger to bluntly dissect down to the posterior aspect of the retroperitoneal fascia I advanced into the retroperitoneum and began bluntly dissecting over the iliopsoas muscle.  I then dissected through the undersurface of the oblique musculature until I could see my finger in the initial incision site.  I then  placed the first dilating trocar over my finger and I brought it down to the surface of the psoas muscle.  I stimulated the psoas muscle to ensure I was not traumatizing the plexus.  Once I confirm this I then advanced the psoas muscle  down to the anterior third of the L3-4 disc space.  Once locked onto the disc space I circumferentially stimulated confirming that there was no adverse EMG activity.  I then sequentially dilated and with each tube I stimulated circumferentially.  Once the final tube was then I obtained the retractor device and passed it over this and secured it to the table.  I stimulated just posterior to the blade and there was no adverse free running EMG activity.  I felt as though I had a good cuff of muscle between plexus and my retracting blade.  I then gently began to reposition the posterior blade posteriorly.  Once I was nearing the posterior third of the disc space I re-stimulated behind the blade superior inferior and anterior to confirm that the plexus was not being traumatized.  In addition there was no adverse SSEP or free running EMG activity.  The posterior blade was then secured into the disc space with the shim.  At this point had excellent visualization of the lateral aspect of the L3-4 disc space.  10 blade scalpel was used to perform an annulotomy and then I used the Cobb elevators to release the disc from the endplate of L3 and L4 as well as releasing the contralateral annulus.  Using the curettes and pituitary rongeurs I remove the remaining portion of disc and cartilaginous endplate until I had bleeding subchondral bone.  I made sure I had a complete discectomy.  Care was taken not to violate either the anterior or posterior longitudinal ligaments.  Once I had a complete discectomy work done I then began using my trial implants starting at the 8 mm parallel implant and proceeding to the 12 mm parallel endplate.  I felt as though the 12 parallel implant provided the best overall distraction and restoration of the foraminal volume.  At this point I felt as though I dressed the lateral recess and foraminal stenosis that was causing his neuropathic leg pain.  I obtained a permanent implant and packed it with  the allograft bone.  I then made sure rasped the endplates to ensure that I had removed all of the disc material.  I then malleted the intervertebral cage into the disc space.  The implant had excellent fixation and was well seated.  I confirmed using both AP and lateral fluoroscopy satisfactory overall positioning of the implant.  The remaining portion of bone graft was then placed over the implant laterally to function as a sentinel fusion.  A 12 mm lateral plate was then obtained and placed into the wound.  Once it was properly positioned over the L3-4 disc space I secured it to the L3 vertebral body with a temporary set pin.  I confirmed with fluoroscopy satisfactory positioning of the lateral cage.  Using the awl I broached the L4 vertebral body until I was at the contralateral side.  I placed a six 5.5 x 60 mm screw through the plate and into the L4 vertebral body.  I remove the temporary set pin and then used the awl to create the screw path into the body of L3.  I placed a 55 mm 5.5 diameter screw in this level.  Both screws had excellent purchase into the bone.  The plate was able to be sucked down and was properly affixed to the vertebral bodies.  At this point I locked the screws using the torque device according manufacture standards.  I then returned the OR table into the neutral position (level) and then locked the plate according to manufacture standards.  At this point the shims were removed and I irrigated the wound copiously with normal saline.  Using bipolar cautery and FloSeal I obtained and maintain hemostasis.  I then remove the retractor and took my final intraoperative x-rays.  In both the AP and lateral planes had excellent positioning of the intervertebral cage as well as the lateral plate and the screws.  I had excellent purchase and stability.  Given this I did not think there is a need to prolong the surgical procedure by doing posterior pedicle screw fixations.  At this point after  final irrigation I closed the fascia of the oblique musculature with interrupted #1 Vicryl suture and I irrigated out the posterior small incision and closed the deep fascia with interrupted #1 Vicryl suture.  The remainder both wounds were then closed in a layered fashion with 2-0 Vicryl suture, and a 3-0 Monocryl.  Steri-Strips and dry dressings were applied and the patient was ultimately extubated Foley was removed and he was transferred to the PACU without incident.  The end of the case all needle sponge counts were correct.  There were no adverse intraoperative events.

## 2019-06-27 NOTE — Discharge Instructions (Signed)
Spinal Fusion, Adult, Care After This sheet gives you information about how to care for yourself after your procedure. Your doctor may also give you more specific instructions. If you have problems or questions, contact your doctor. Follow these instructions at home: Medicines  Take over-the-counter and prescription medicines only as told by your doctor. These include any medicines for pain or blood-thinning medicines (anticoagulants).  If you were prescribed an antibiotic medicine, take it as told by your doctor. Do not stop taking the antibiotic even if you start to feel better.  Do not drive for 24 hours if you were given a medicine to help you relax (sedative) during your procedure.  Do not drive or use heavy machinery while taking prescription pain medicine. If you have a brace:  Wear the brace as told by your doctor. Take it off only as told by your doctor.  Keep the brace clean. Managing pain, stiffness, and swelling  If directed, put ice on the surgery area: ? If you have a removable brace, take it off as told by your doctor. ? Put ice in a plastic bag. ? Place a towel between your skin and the bag. ? Leave the ice on for 20 minutes, 2-3 times a day. Surgery cut care     Follow instructions from your doctor about how to take care of your cut from surgery (incision). Make sure you: ? Wash your hands with soap and water before you change your bandage (dressing). If you cannot use soap and water, use hand sanitizer. ? Change your bandage as told by your doctor. ? Leave stitches (sutures), skin glue, or skin tape (adhesive) strips in place. They may need to stay in place for 2 weeks or longer. If tape strips get loose and curl up, you may trim the loose edges. Do not remove tape strips completely unless your doctor says it is okay.  Keep your cut from surgery clean and dry. ? Do not take baths, swim, or use a hot tub until your doctor says it is okay. ? Ask your doctor if you  can take showers. You may only be allowed to take sponge baths.  Every day, check your cut from surgery and the area around it for: ? More redness, swelling, or pain. ? Fluid or blood. ? Warmth. ? Pus or a bad smell.  If you have a drain tube, follow instructions from your doctor about caring for it. Do not take out the drain tube or any bandages unless your doctor says it is okay. Physical activity  Rest and protect your back as much as possible.  Follow instructions from your doctor about how to move. Use good posture to help your spine heal.  Do not lift anything that is heavier than 8 lb (3.6 kg), or the limit that you are told, until your doctor says that it is safe.  Do not twist or bend at the waist until your doctor says it is okay.  It is best if you: ? Do not make pushing and pulling motions. ? Do not sit or lie down in the same position for a long time. ? Do not raise your hands or arms above your head.  Return to your normal activities as told by your doctor. Ask your doctor what activities are safe for you. Rest and protect your back as much as you can.  Do not start to exercise until your doctor says it is okay. Ask your doctor what kinds of exercise you  can do to make your back stronger.  Ok to shower in 5 days.  Do not take a bath or submerge the wound General instructions  To prevent blood clots and lessen swelling in your legs: ? Wear compression stockings as told. ? Walk one or more times every few hours as told by your doctor.  Do not use any products that contain nicotine or tobacco, such as cigarettes and e-cigarettes. These can delay bone healing. If you need help quitting, ask your doctor.  To prevent or treat constipation while you are taking prescription pain medicine, your doctor may suggest that you: ? Drink enough fluid to keep your pee (urine) pale yellow. ? Take over-the-counter or prescription medicines. ? Eat foods that are high in fiber. These  include fresh fruits and vegetables, whole grains, and beans. ? Limit foods that are high in fat and processed sugars, such as fried and sweet foods.  Keep all follow-up visits as told by your doctor. This is important. Contact a doctor if:  Your pain gets worse.  Your medicine does not help your pain.  Your legs or feet get painful or swollen.  Your cut from surgery is more red, swollen, or painful.  Your cut from surgery feels warm to the touch.  You have: ? Fluid or blood coming from your cut from surgery. ? Pus or a bad smell coming from your cut from surgery. ? A fever. ? Weakness or loss of feeling (numbness) in your legs that is new or getting worse. ? Trouble controlling when you pee (urinate) or poop (have a bowel movement).  You feel sick to your stomach (nauseous).  You throw up (vomit). Get help right away if:  Your pain is very bad.  You have chest pain.  You have trouble breathing.  You start to have a cough. These symptoms may be an emergency. Do not wait to see if the symptoms will go away. Get medical help right away. Call your local emergency services (911 in the [place] Do not drive yourself to the hospital. Summary  After the procedure, it is common to have pain in your back and pain by your surgery cut(s).  Icing and pain medicines may help to control the pain. Follow directions from your doctor.  Rest and protect your back as much as possible. Do not twist or bend at the waist.  Get up and walk one or more times every few hours as told by your doctor. This information is not intended to replace advice given to you by your health care provider. Make sure you discuss any questions you have with your health care provider.  Enoxaparin injection What is this medicine? ENOXAPARIN (ee nox a PA rin) is used after knee, hip, or abdominal surgeries to prevent blood clotting. It is also used to treat existing blood clots in the lungs or in the veins. This  medicine may be used for other purposes; ask your health care provider or pharmacist if you have questions. COMMON BRAND NAME(S): Lovenox What should I tell my health care provider before I take this medicine? They need to know if you have any of these conditions: -bleeding disorders, hemorrhage, or hemophilia -infection of the heart or heart valves -kidney or liver disease -previous stroke -prosthetic heart valve -recent surgery or delivery of a baby -ulcer in the stomach or intestine, diverticulitis, or other bowel disease -an unusual or allergic reaction to enoxaparin, heparin, pork or pork products, other medicines, foods, dyes, or preservatives -  pregnant or trying to get pregnant -breast-feeding How should I use this medicine? This medicine is for injection under the skin. It is usually given by a health-care professional. You or a family member may be trained on how to give the injections. If you are to give yourself injections, make sure you understand how to use the syringe, measure the dose if necessary, and give the injection. To avoid bruising, do not rub the site where this medicine has been injected. Do not take your medicine more often than directed. Do not stop taking except on the advice of your doctor or health care professional. Make sure you receive a puncture-resistant container to dispose of the needles and syringes once you have finished with them. Do not reuse these items. Return the container to your doctor or health care professional for proper disposal. Talk to your pediatrician regarding the use of this medicine in children. Special care may be needed. Overdosage: If you think you have taken too much of this medicine contact a poison control center or emergency room at once. NOTE: This medicine is only for you. Do not share this medicine with others. What if I miss a dose? If you miss a dose, take it as soon as you can. If it is almost time for your next dose, take  only that dose. Do not take double or extra doses. What may interact with this medicine? -aspirin and aspirin-like medicines -certain medicines that treat or prevent blood clots -dipyridamole -NSAIDs, medicines for pain and inflammation, like ibuprofen or naproxen This list may not describe all possible interactions. Give your health care provider a list of all the medicines, herbs, non-prescription drugs, or dietary supplements you use. Also tell them if you smoke, drink alcohol, or use illegal drugs. Some items may interact with your medicine. What should I watch for while using this medicine? Visit your healthcare professional for regular checks on your progress. You may need blood work done while you are taking this medicine. Your condition will be monitored carefully while you are receiving this medicine. It is important not to miss any appointments. If you are going to need surgery or other procedure, tell your healthcare professional that you are using this medicine. Using this medicine for a long time may weaken your bones and increase the risk of bone fractures. Avoid sports and activities that might cause injury while you are using this medicine. Severe falls or injuries can cause unseen bleeding. Be careful when using sharp tools or knives. Consider using an Copy. Take special care brushing or flossing your teeth. Report any injuries, bruising, or red spots on the skin to your healthcare professional. Wear a medical ID bracelet or chain. Carry a card that describes your disease and details of your medicine and dosage times. What side effects may I notice from receiving this medicine? Side effects that you should report to your doctor or health care professional as soon as possible: -allergic reactions like skin rash, itching or hives, swelling of the face, lips, or tongue -bone pain -signs and symptoms of bleeding such as bloody or black, tarry stools; red or dark-brown urine;  spitting up blood or brown material that looks like coffee grounds; red spots on the skin; unusual bruising or bleeding from the eye, gums, or nose -signs and symptoms of a blood clot such as chest pain; shortness of breath; pain, swelling, or warmth in the leg -signs and symptoms of a stroke such as changes in vision; confusion; trouble  speaking or understanding; severe headaches; sudden numbness or weakness of the face, arm or leg; trouble walking; dizziness; loss of coordination Side effects that usually do not require medical attention (report to your doctor or health care professional if they continue or are bothersome): -hair loss -pain, redness, or irritation at site where injected This list may not describe all possible side effects. Call your doctor for medical advice about side effects. You may report side effects to FDA at 1-800-FDA-1088. Where should I keep my medicine? Keep out of the reach of children. Store at room temperature between 15 and 30 degrees C (59 and 86 degrees F). Do not freeze. If your injections have been specially prepared, you may need to store them in the refrigerator. Ask your pharmacist. Throw away any unused medicine after the expiration date. NOTE: This sheet is a summary. It may not cover all possible information. If you have questions about this medicine, talk to your doctor, pharmacist, or health care provider.

## 2019-06-27 NOTE — Transfer of Care (Signed)
Immediate Anesthesia Transfer of Care Note  Patient: Tristan Wood  Procedure(s) Performed: Anterior lateral interbody fusion LUMBAR THREE-FOUR with posterior spinal fusion interbody LUMBAR THREE-FOUR (N/A ) POSTERIOR LUMBAR FUSION 1 LEVEL (N/A )  Patient Location: PACU  Anesthesia Type:General  Level of Consciousness: awake, alert , oriented and sedated  Airway & Oxygen Therapy: Patient Spontanous Breathing and Patient connected to nasal cannula oxygen  Post-op Assessment: Report given to RN, Post -op Vital signs reviewed and stable and Patient moving all extremities  Post vital signs: Reviewed and stable  Last Vitals:  Vitals Value Taken Time  BP 151/94 06/27/19 1026  Temp 36.5 C 06/27/19 1025  Pulse 86 06/27/19 1029  Resp 18 06/27/19 1029  SpO2 94 % 06/27/19 1029  Vitals shown include unvalidated device data.  Last Pain:  Vitals:   06/27/19 0623  TempSrc:   PainSc: 8       Patients Stated Pain Goal: 3 (25/74/93 5521)  Complications: No apparent anesthesia complications

## 2019-06-27 NOTE — H&P (Addendum)
No change in clinical exam from last office evaluation. 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   Patient has had previous decompression for HNP with radiculopathy.   Despite appropriate conservative management he is continued to have significant debilitating pain.  As a result we have elected to move forward with an L3-4 fusion with supplemental posterior pedicle screw fixation.  I did tell him that if we are able to apply a lateral plate that this may alleviate the need for pedicle screw fixation posteriorly.  He is aware of the surgical plan as well as the risks and benefits and is in agreement.

## 2019-06-27 NOTE — Brief Op Note (Signed)
06/27/2019  10:03 AM  PATIENT:  Tristan Wood  53 y.o. 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 LUMBAR THREE-FOUR  PROCEDURE:  Procedure(s) with comments: Anterior lateral interbody fusion LUMBAR THREE-FOUR with posterior spinal fusion interbody LUMBAR THREE-FOUR (N/A) - 4.5 hrs for both procedures POSTERIOR LUMBAR FUSION 1 LEVEL (N/A)  SURGEON:  Surgeon(s) and Role:    Melina Schools, MD - Primary  PHYSICIAN ASSISTANT:   ASSISTANTS: Amanda Ward, PA   ANESTHESIA:   general  EBL:  25 mL   BLOOD ADMINISTERED:none  DRAINS: none   LOCAL MEDICATIONS USED:  MARCAINE     SPECIMEN:  No Specimen  DISPOSITION OF SPECIMEN:  N/A  COUNTS:  YES  TOURNIQUET:  * No tourniquets in log *  DICTATION: .Dragon Dictation  PLAN OF CARE: Admit to inpatient   PATIENT DISPOSITION:  PACU - hemodynamically stable.

## 2019-06-27 NOTE — Anesthesia Postprocedure Evaluation (Signed)
Anesthesia Post Note  Patient: Tristan Wood  Procedure(s) Performed: Anterior lateral interbody fusion LUMBAR THREE-FOUR with posterior spinal fusion interbody LUMBAR THREE-FOUR (N/A ) POSTERIOR LUMBAR FUSION 1 LEVEL (N/A )     Patient location during evaluation: PACU Anesthesia Type: General Level of consciousness: awake and alert, awake and oriented Pain management: pain level controlled Vital Signs Assessment: post-procedure vital signs reviewed and stable Respiratory status: spontaneous breathing, nonlabored ventilation and respiratory function stable Cardiovascular status: blood pressure returned to baseline and stable Postop Assessment: no apparent nausea or vomiting Anesthetic complications: no    Last Vitals:  Vitals:   06/27/19 1110 06/27/19 1134  BP: (!) 142/94 133/83  Pulse: 82 71  Resp: 16 20  Temp:  36.4 C  SpO2: 93% 95%    Last Pain:  Vitals:   06/27/19 1157  TempSrc:   PainSc: [place]

## 2019-06-27 NOTE — Evaluation (Signed)
Physical Therapy Evaluation Patient Details Name: Tristan Wood MRN: [id number] DOB: 18-May-1966 Today's Date: 06/27/2019   History of Present Illness  Pt is a 53 y/o male s/p L3-4 ALIF. PMH includes basal cell carcinoma and GERD.   Clinical Impression  Patient is s/p above surgery resulting in the deficits listed below (see PT Problem List). Pt requiring min to min guard A for mobility tasks this session. Pt very guarded throughout secondary to pain. Educated about back precautions and generalized walking program. Pt reports wife can assist as needed at d/c. Patient will benefit from skilled PT to increase their independence and safety with mobility (while adhering to their precautions) to allow discharge to the venue listed below.     Follow Up Recommendations No PT follow up;Supervision for mobility/OOB    Equipment Recommendations  None recommended by PT    Recommendations for Other Services       Precautions / Restrictions Precautions Precautions: Back Precaution Booklet Issued: Yes (comment) Precaution Comments: Reviewed back precautions with pt.  Required Braces or Orthoses: Spinal Brace Spinal Brace: Lumbar corset Restrictions Weight Bearing Restrictions: No      Mobility  Bed Mobility Overal bed mobility: Needs Assistance Bed Mobility: Rolling;Sidelying to Sit;Sit to Sidelying Rolling: Supervision Sidelying to sit: Supervision     Sit to sidelying: Supervision General bed mobility comments: Supervision and increased time to come to sitting.   Transfers Overall transfer level: Needs assistance Equipment used: None;Rolling walker (2 wheeled) Transfers: Sit to/from Stand Sit to Stand: Min assist;Min guard;From elevated surface         General transfer comment: Without AD, pt requiring min a for lift assist and steadying. Pt initially unable to bear any weight on LLE. Pt with improved steadiness with use of RW.   Ambulation/Gait Ambulation/Gait assistance:  Min guard Gait Distance (Feet): 200 Feet Assistive device: Rolling walker (2 wheeled) Gait Pattern/deviations: Step-through pattern;Decreased stride length;Trunk flexed Gait velocity: Decreased    General Gait Details: Slow, very guarded gait. Pt with heavy reliance on UEs to offweight LLE secondary to pain. Cues for sequencing with use of RW. Educated about generalized walking program to perform at home.   Stairs            Wheelchair Mobility    Modified Rankin (Stroke Patients Only)       Balance Overall balance assessment: Needs assistance Sitting-balance support: No upper extremity supported;Feet supported Sitting balance-Leahy Scale: Fair     Standing balance support: Bilateral upper extremity supported;During functional activity Standing balance-Leahy Scale: Poor Standing balance comment: Heavy reliance on UE support                              Pertinent Vitals/Pain Pain Assessment: 0-10 Pain Score: 8  Pain Location: back into L hip  Pain Descriptors / Indicators: Aching;Grimacing;Guarding Pain Intervention(s): Limited activity within patient's tolerance;Monitored during session;Repositioned    Home Living Family/patient expects to be discharged to:: Private residence Living Arrangements: Spouse/significant other Available Help at Discharge: Family;Available 24 hours/day Type of Home: House Home Access: Stairs to enter Entrance Stairs-Rails: None Entrance Stairs-Number of Steps: 1(wide enough to fit RW) Home Layout: One level Home Equipment: Walker - 2 wheels;Shower seat - built in;Grab bars - tub/shower      Prior Function Level of Independence: Independent               Hand Dominance        Extremity/Trunk Assessment  Upper Extremity Assessment Upper Extremity Assessment: Defer to OT evaluation    Lower Extremity Assessment Lower Extremity Assessment: Generalized weakness    Cervical / Trunk Assessment Cervical /  Trunk Assessment: Other exceptions Cervical / Trunk Exceptions: s/p ALIF   Communication   Communication: No difficulties  Cognition Arousal/Alertness: Awake/alert Behavior During Therapy: WFL for tasks assessed/performed Overall Cognitive Status: Within Functional Limits for tasks assessed                                        General Comments      Exercises     Assessment/Plan    PT Assessment Patient needs continued PT services  PT Problem List Decreased strength;Decreased range of motion;Decreased activity tolerance;Decreased balance;Decreased mobility;Decreased knowledge of precautions;Pain       PT Treatment Interventions DME instruction;Gait training;Stair training;Functional mobility training;Therapeutic activities;Therapeutic exercise;Balance training;Patient/family education    PT Goals (Current goals can be found in the Care Plan section)  Acute Rehab PT Goals Patient Stated Goal: to decrease pain  PT Goal Formulation: With patient Time For Goal Achievement: 07/11/19 Potential to Achieve Goals: Good    Frequency Min 5X/week   Barriers to discharge        Co-evaluation               AM-PAC PT "6 Clicks" Mobility  Outcome Measure Help needed turning from your back to your side while in a flat bed without using bedrails?: A Little Help needed moving from lying on your back to sitting on the side of a flat bed without using bedrails?: A Little Help needed moving to and from a bed to a chair (including a wheelchair)?: A Little Help needed standing up from a chair using your arms (e.g., wheelchair or bedside chair)?: A Little Help needed to walk in hospital room?: A Little Help needed climbing 3-5 steps with a railing? : A Little 6 Click Score: 18    End of Session   Activity Tolerance: Patient limited by pain Patient left: in bed;with call bell/phone within reach Nurse Communication: Mobility status PT Visit Diagnosis: Other  abnormalities of gait and mobility (R26.89);Pain Pain - part of body: (back)    Time: 1429-1500 PT Time Calculation (min) (ACUTE ONLY): 31 min   Charges:   PT Evaluation $PT Eval Low Complexity: 1 Low PT Treatments $Gait Training: 8-22 mins        Leighton Ruff, PT, DPT  Acute Rehabilitation Services  Pager: [phone] Office: [phone]   Rudean Hitt 06/27/2019, 6:23 PM

## 2019-06-28 ENCOUNTER — Encounter (HOSPITAL_COMMUNITY): Payer: Self-pay | Admitting: Orthopedic Surgery

## 2019-06-28 NOTE — Progress Notes (Signed)
Patient is discharged from room 3C09 at this time. Alert and in stable condition. IV site d/c'd and instructions read to patient with understanding verbalized. Left unit via wheelchair with all belongings at side.  

## 2019-06-28 NOTE — Evaluation (Signed)
Occupational Therapy Evaluation Patient Details Name: Tristan Wood MRN: [id number] DOB: 08-07-1966 Today's Date: 06/28/2019    History of Present Illness Pt is a 53 y/o male s/p L3-4 ALIF. PMH includes basal cell carcinoma and GERD.    Clinical Impression   This 53 y/o male presents with the above. PTA pt was independent with ADL and functional mobility. Pt performing functional transfers this session with/without RW at minguard-supervision level. He currently requires minguard assist for LB ADL, setup/supervision for seated UB ADL. Reviewed back precautions, brace management, safety and compensatory strategies for performing ADL and functional transfers after return home with pt verbalizing understanding. Pt reports plans to return home with spouse assist for ADL/iADL PRN. Questions answered throughout with no further acute OT needs identified. Acute OT to sign off at this time. Thank you for this referral.    Follow Up Recommendations  No OT follow up;Supervision/Assistance - 24 hour    Equipment Recommendations  None recommended by OT(pt's DME needs are met)           Precautions / Restrictions Precautions Precautions: Back Precaution Booklet Issued: Yes (comment) Precaution Comments: Reviewed back precautions with pt. pt able to recall 3/3 precautions Required Braces or Orthoses: Spinal Brace Spinal Brace: Lumbar corset Restrictions Weight Bearing Restrictions: No      Mobility Bed Mobility               General bed mobility comments: pt received OOB in recliner  Transfers Overall transfer level: Needs assistance Equipment used: None;Rolling walker (2 wheeled) Transfers: Sit to/from Stand Sit to Stand: Supervision         General transfer comment: supervision to rise from recliner using bil UE on armrests; performed x2    Balance Overall balance assessment: Needs assistance Sitting-balance support: No upper extremity supported;Feet supported Sitting  balance-Leahy Scale: Fair     Standing balance support: Bilateral upper extremity supported;During functional activity Standing balance-Leahy Scale: Fair Standing balance comment: pt able to static stand without AD and minguard assist                           ADL either performed or assessed with clinical judgement   ADL Overall ADL's : Needs assistance/impaired Eating/Feeding: Independent;Sitting   Grooming: Set up;Sitting Grooming Details (indicate cue type and reason): reviewed compensatory strategy for oral care  Upper Body Bathing: Set up;Sitting   Lower Body Bathing: Min guard;Sit to/from stand Lower Body Bathing Details (indicate cue type and reason): educated to perform from sit<>stand level for increased safety/adherence to precautions (vs standing only); pt reports he also has a LH sponge for ADL task Upper Body Dressing : Set up;Sitting Upper Body Dressing Details (indicate cue type and reason): reports feeling comfortable managing lumbar brace Lower Body Dressing: Supervision/safety;Sit to/from stand Lower Body Dressing Details (indicate cue type and reason): pt able to utilize figure 4 technique for LB dressing tasks; pt dressed upon arrival Toilet Transfer: Min guard;RW   [place] and Hygiene: Min guard;Sit to/from stand       Functional mobility during ADLs: Supervision/safety;Min guard;Rolling walker                           Pertinent Vitals/Pain Pain Assessment: Faces Faces Pain Scale: Hurts little more Pain Location: back into L hip  Pain Descriptors / Indicators: Aching;Grimacing;Guarding Pain Intervention(s): Monitored during session;Repositioned     Hand Dominance  Extremity/Trunk Assessment Upper Extremity Assessment Upper Extremity Assessment: Overall WFL for tasks assessed   Lower Extremity Assessment Lower Extremity Assessment: Defer to PT evaluation   Cervical / Trunk Assessment Cervical /  Trunk Assessment: Other exceptions Cervical / Trunk Exceptions: s/p ALIF    Communication Communication Communication: No difficulties   Cognition Arousal/Alertness: Awake/alert Behavior During Therapy: WFL for tasks assessed/performed Overall Cognitive Status: Within Functional Limits for tasks assessed                                     General Comments       Exercises     Shoulder Instructions      Home Living Family/patient expects to be discharged to:: Private residence Living Arrangements: Spouse/significant other Available Help at Discharge: Family;Available 24 hours/day Type of Home: House Home Access: Stairs to enter [employer] of Steps: 1(wide enough to fit RW) Entrance Stairs-Rails: None Home Layout: One level     Bathroom Shower/Tub: Occupational psychologist: Handicapped height     Home Equipment: Environmental consultant - 2 wheels;Shower seat - built in;Grab bars - tub/shower;Grab bars - toilet          Prior Functioning/Environment Level of Independence: Independent                 OT Problem List: Decreased strength;Decreased range of motion;Decreased activity tolerance;Impaired balance (sitting and/or standing);Decreased knowledge of precautions;Pain      OT Treatment/Interventions:      OT Goals(Current goals can be found in the care plan section) Acute Rehab OT Goals Patient Stated Goal: to decrease pain  OT Goal Formulation: All assessment and education complete, DC therapy  OT Frequency:     Barriers to D/C:            Co-evaluation              AM-PAC OT "6 Clicks" Daily Activity     Outcome Measure Help from another person eating meals?: None Help from another person taking care of personal grooming?: None Help from another person toileting, which includes using toliet, bedpan, or urinal?: None Help from another person bathing (including washing, rinsing, drying)?: A Little Help from another person  to put on and taking off regular upper body clothing?: None Help from another person to put on and taking off regular lower body clothing?: None 6 Click Score: 23   End of Session Equipment Utilized During Treatment: Back brace Nurse Communication: Mobility status  Activity Tolerance: Patient tolerated treatment well Patient left: in chair;with call bell/phone within reach  OT Visit Diagnosis: Other abnormalities of gait and mobility (R26.89);Pain Pain - Right/Left: Left Pain - part of body: Leg(back)                Time: 9758-8325 OT Time Calculation (min): 13 min Charges:  OT General Charges $OT Visit: 1 Visit OT Evaluation $OT Eval Low Complexity: Elida, OT Supplemental Rehabilitation Services Pager [phone] Office [phone]   Raymondo Band 06/28/2019, 9:39 AM

## 2019-06-28 NOTE — Progress Notes (Signed)
Physical Therapy Treatment Patient Details Name: Tristan Wood MRN: [id number] DOB: 07/22/1966 Today's Date: 06/28/2019    History of Present Illness Pt is a 53 y/o male s/p L3-4 ALIF. PMH includes basal cell carcinoma and GERD.     PT Comments    Pt progressing towards physical therapy goals. Was able to perform transfers and ambulation with gross supervision for safety and RW for support. Pt reporting 10/10 pain however appeared to tolerate mobility well and faces pain scale was  6/10. Will continue to follow and progress as able per POC.    Follow Up Recommendations  No PT follow up;Supervision for mobility/OOB     Equipment Recommendations  None recommended by PT    Recommendations for Other Services       Precautions / Restrictions Precautions Precautions: Back Precaution Booklet Issued: Yes (comment) Precaution Comments: Reviewed back precautions with pt. pt able to recall 3/3 precautions Required Braces or Orthoses: Spinal Brace Spinal Brace: Lumbar corset Restrictions Weight Bearing Restrictions: No    Mobility  Bed Mobility Overal bed mobility: Needs Assistance Bed Mobility: Rolling;Sidelying to Sit Rolling: Supervision Sidelying to sit: Supervision       General bed mobility comments: pt received OOB in recliner  Transfers Overall transfer level: Needs assistance Equipment used: None;Rolling walker (2 wheeled) Transfers: Sit to/from Stand Sit to Stand: Supervision         General transfer comment: VC's for hand placement on seated surface for safety.   Ambulation/Gait Ambulation/Gait assistance: Supervision Gait Distance (Feet): 400 Feet Assistive device: Rolling walker (2 wheeled) Gait Pattern/deviations: Step-through pattern;Decreased stride length;Trunk flexed Gait velocity: Decreased  Gait velocity interpretation: <1.8 ft/sec, indicate of risk for recurrent falls General Gait Details: Slow but generally steady. Heavily reliant on RW with  poor posture and elevated shoulders. Pt cued for improvement in walker position and posture but not willing to make corrective changes.    Stairs             Wheelchair Mobility    Modified Rankin (Stroke Patients Only)       Balance Overall balance assessment: Needs assistance Sitting-balance support: No upper extremity supported;Feet supported Sitting balance-Leahy Scale: Fair     Standing balance support: Bilateral upper extremity supported;During functional activity Standing balance-Leahy Scale: Fair Standing balance comment: pt able to static stand without AD and minguard assist                            Cognition Arousal/Alertness: Awake/alert Behavior During Therapy: WFL for tasks assessed/performed Overall Cognitive Status: Within Functional Limits for tasks assessed                                        Exercises      General Comments        Pertinent Vitals/Pain Pain Assessment: 0-10 Pain Score: 10-Worst pain ever Faces Pain Scale: Hurts even more Pain Location: back into L hip  Pain Descriptors / Indicators: Aching;Grimacing;Guarding Pain Intervention(s): Monitored during session    Home Living Family/patient expects to be discharged to:: Private residence Living Arrangements: Spouse/significant other Available Help at Discharge: Family;Available 24 hours/day Type of Home: House Home Access: Stairs to enter Entrance Stairs-Rails: None Home Layout: One level Home Equipment: Environmental consultant - 2 wheels;Shower seat - built in;Grab bars - tub/shower;Grab bars - toilet      Prior Function  Level of Independence: Independent          PT Goals (current goals can now be found in the care plan section) Acute Rehab PT Goals Patient Stated Goal: to decrease pain  PT Goal Formulation: With patient Time For Goal Achievement: 07/11/19 Potential to Achieve Goals: Good    Frequency    Min 5X/week      PT Plan       Co-evaluation              AM-PAC PT "6 Clicks" Mobility   Outcome Measure  Help needed turning from your back to your side while in a flat bed without using bedrails?: A Little Help needed moving from lying on your back to sitting on the side of a flat bed without using bedrails?: A Little Help needed moving to and from a bed to a chair (including a wheelchair)?: A Little Help needed standing up from a chair using your arms (e.g., wheelchair or bedside chair)?: A Little Help needed to walk in hospital room?: A Little Help needed climbing 3-5 steps with a railing? : A Little 6 Click Score: 18    End of Session Equipment Utilized During Treatment: Gait belt Activity Tolerance: Patient limited by pain Patient left: in bed;with call bell/phone within reach Nurse Communication: Mobility status PT Visit Diagnosis: Other abnormalities of gait and mobility (R26.89);Pain Pain - part of body: (back)     Time: 0347-4259 PT Time Calculation (min) (ACUTE ONLY): 30 min  Charges:  $Gait Training: 23-37 mins                     Rolinda Roan, PT, DPT Acute Rehabilitation Services Pager: [phone] Office: [phone]    Thelma Comp 06/28/2019, 1:19 PM

## 2019-06-28 NOTE — Progress Notes (Signed)
    Subjective: Procedure(s) (LRB): Anterior lateral interbody fusion LUMBAR THREE-FOUR with posterior spinal fusion interbody LUMBAR THREE-FOUR (N/A) POSTERIOR LUMBAR FUSION 1 LEVEL (N/A) 1 Day Post-Op  Patient reports pain as 4 on 0-10 scale.  Reports decreased leg pain reports incisional back pain  - pain radiating into the left groin as expected given trans-psoas approach Positive void Negative bowel movement Positive flatus Negative chest pain or shortness of breath  Objective: Vital signs in last 24 hours: Temp:  [97.6 F (36.4 C)-98 F (36.7 C)] 98 F (36.7 C) (07/31 0332) Pulse Rate:  [68-94] 68 (07/31 0332) Resp:  [16-20] 18 (07/31 0332) BP: (122-151)/(75-94) 132/87 (07/31 0332) SpO2:  [93 %-98 %] 97 % (07/31 0332)  Intake/Output from previous day: 07/30 0701 - 07/31 0700 In: 2340 [P.O.:240; I.V.:1650; IV Piggyback:450] Out: 425 [Urine:400; Blood:25]  Labs: No results for input(s): WBC, RBC, HCT, PLT in the last 72 hours. No results for input(s): NA, K, CL, CO2, BUN, CREATININE, GLUCOSE, CALCIUM in the last 72 hours. No results for input(s): LABPT, INR in the last 72 hours.  Physical Exam: Neurologically intact ABD soft Intact pulses distally Incision: dressing C/D/I and no drainage Compartment soft Body mass index is 30.52 kg/m.   Assessment/Plan: Patient stable  xrays n/a Continue mobilization with physical therapy Continue care  Advance diet Up with therapy  Patient cleared from PT standpoint Doing well - pain from yesterday improved Positve flatus and no abdominal distention Plan on d/c to home  Melina Schools, MD [facility] [phone]

## 2019-07-01 NOTE — Discharge Summary (Signed)
Patient ID: Tristan Wood MRN: [id number] DOB/AGE: [birth year] 53 y.o.  Admit date: 06/27/2019 Discharge date: 07/01/2019  Admission Diagnoses:  Active Problems:   Fusion of lumbosacral spine   Discharge Diagnoses:  Active Problems:   Fusion of lumbosacral spine  status post Procedure(s): Anterior lateral interbody fusion LUMBAR THREE-FOUR with posterior spinal fusion interbody LUMBAR THREE-FOUR  Past Medical History:  Diagnosis Date  . Basal cell carcinoma   . GERD (gastroesophageal reflux disease)   . High cholesterol   . History of kidney stones   . Spinal stenosis     Surgeries: Procedure(s): Anterior lateral interbody fusion LUMBAR THREE-FOUR with posterior spinal fusion interbody LUMBAR THREE-FOUR on 06/27/2019   Consultants:   Discharged Condition: Improved  Hospital Course: Tristan Wood is an 53 y.o. male who was admitted 06/27/2019 for operative treatment ofPost laminectomy syndrome with recurrent herniated disc and stenosis LUMBAR THREE-FOUR . Patient failed conservative treatments (please see the history and physical for the specifics) and had severe unremitting pain that affects sleep, daily activities and work/hobbies. After pre-op clearance, the patient was taken to the operating room on 06/27/2019 and underwent  Procedure(s): Anterior lateral interbody fusion LUMBAR THREE-FOUR with posterior spinal fusion interbody LUMBAR THREE-FOUR.    Patient was given perioperative antibiotics:  Anti-infectives (From admission, onward)   Start     Dose/Rate Route Frequency Ordered Stop   06/27/19 1600  ceFAZolin (ANCEF) IVPB 1 g/50 mL premix     1 g 100 mL/hr over 30 Minutes Intravenous Every 8 hours 06/27/19 1130 06/28/19 0755   06/27/19 0549  ceFAZolin (ANCEF) 2-4 GM/100ML-% IVPB    Note to Pharmacy: Tamsen Snider   : cabinet override      06/27/19 0549 06/27/19 0756   06/27/19 0548  ceFAZolin (ANCEF) IVPB 2g/100 mL premix     2 g 200 mL/hr over 30 Minutes  Intravenous 30 min pre-op 06/27/19 0548 06/27/19 0756       Patient was given sequential compression devices and early ambulation to prevent DVT.   Patient benefited maximally from hospital stay and there were no complications. At the time of discharge, the patient was urinating/moving their bowels without difficulty, tolerating a regular diet, pain is controlled with oral pain medications and they have been cleared by PT/OT.   Recent vital signs: No data found.   Recent laboratory studies: No results for input(s): WBC, HGB, HCT, PLT, NA, K, CL, CO2, BUN, CREATININE, GLUCOSE, INR, CALCIUM in the last 72 hours.  Invalid input(s): PT, 2   Discharge Medications:   Allergies as of 06/28/2019   No Known Allergies     Medication List    TAKE these medications   gabapentin 300 MG capsule Commonly known as: NEURONTIN Take 300 mg by mouth at bedtime.   methocarbamol 500 MG tablet Commonly known as: Robaxin Take 1 tablet (500 mg total) by mouth every 8 (eight) hours as needed for up to 5 days for muscle spasms.   nicotine 21 mg/24hr patch Commonly known as: NICODERM CQ - dosed in mg/24 hours Place 21 mg onto the skin daily.   omeprazole 20 MG capsule Commonly known as: PRILOSEC Take 20 mg by mouth daily.   ondansetron 4 MG tablet Commonly known as: Zofran Take 1 tablet (4 mg total) by mouth every 8 (eight) hours as needed for nausea or vomiting.   oxyCODONE-acetaminophen 10-325 MG tablet Commonly known as: Percocet Take 1 tablet by mouth every 6 (six) hours as needed for up to 5  days for pain.   sertraline 50 MG tablet Commonly known as: ZOLOFT Take 50 mg by mouth daily.   zolpidem 12.5 MG CR tablet Commonly known as: AMBIEN CR Take 12.5 mg by mouth at bedtime as needed for sleep.       Diagnostic Studies: Dg Chest 2 View  Result Date: 06/24/2019 CLINICAL DATA:  Preoperative EXAM: CHEST - 2 VIEW COMPARISON:  None. FINDINGS: The heart size and mediastinal contours are  within normal limits. Both lungs are clear. The visualized skeletal structures are unremarkable. IMPRESSION: No acute abnormality of the lungs. Electronically Signed   By: Eddie Candle M.D.   On: 06/24/2019 08:57   Dg Lumbar Spine 2-3 Views  Result Date: 06/27/2019 CLINICAL DATA:  L3-4 XLIF Fluoro time 2 minutes, 20 seconds Radiation safety time out- Autumn Volanda Napoleon and Norva Riffle Dr. Rolena Infante EXAM: DG C-ARM 61-120 MIN; LUMBAR SPINE - 2-3 VIEW COMPARISON:  CT 03/13/2018 FINDINGS: Two images demonstrate LATERAL fixation at level L3-4, by history. IMPRESSION: L3-4 XLIF. Electronically Signed   By: Nolon Nations M.D.   On: 06/27/2019 12:36   Dg C-arm 1-60 Min  Result Date: 06/27/2019 CLINICAL DATA:  L3-4 XLIF Fluoro time 2 minutes, 20 seconds Radiation safety time out- Autumn Volanda Napoleon and Norva Riffle Dr. Rolena Infante EXAM: DG C-ARM 61-120 MIN; LUMBAR SPINE - 2-3 VIEW COMPARISON:  CT 03/13/2018 FINDINGS: Two images demonstrate LATERAL fixation at level L3-4, by history. IMPRESSION: L3-4 XLIF. Electronically Signed   By: Nolon Nations M.D.   On: 06/27/2019 12:36    Discharge Instructions    Incentive spirometry RT   Complete by: As directed       Follow-up Information    Melina Schools, MD. Schedule an appointment as soon as possible for a visit in 2 weeks.   Specialty: Orthopedic Surgery Why: If symptoms worsen, For suture removal, For wound re-check Contact information: [address] [place] [address] [phone]           Discharge Plan:  discharge to home  Disposition:  stable    Signed: Yvonne Kendall Ward for [facility] PA-C [facility] [phone] 07/01/2019, 1:12 PM

## 2019-09-30 ENCOUNTER — Other Ambulatory Visit: Payer: Self-pay | Admitting: Orthopedic Surgery

## 2019-09-30 DIAGNOSIS — Z4889 Encounter for other specified surgical aftercare: Secondary | ICD-10-CM

## 2019-10-03 ENCOUNTER — Ambulatory Visit
Admission: RE | Admit: 2019-10-03 | Discharge: 2019-10-03 | Disposition: A | Discharge status: Home / Self Care | Payer: 59 | Source: Ambulatory Visit | Attending: Orthopedic Surgery | Admitting: Orthopedic Surgery

## 2019-10-03 ENCOUNTER — Other Ambulatory Visit: Payer: Self-pay

## 2019-10-03 DIAGNOSIS — Z4889 Encounter for other specified surgical aftercare: Secondary | ICD-10-CM

## 2019-10-03 MED ORDER — METHYLPREDNISOLONE ACETATE 40 MG/ML INJ SUSP (RADIOLOG
120.0000 mg | Freq: Once | INTRAMUSCULAR | Status: AC
  Administered 2019-10-03: 120 mg via EPIDURAL

## 2019-10-03 MED ORDER — IOPAMIDOL (ISOVUE-M 200) INJECTION 41%
1.0000 mL | Freq: Once | INTRAMUSCULAR | Status: AC
  Administered 2019-10-03: 11:00:00 1 mL via EPIDURAL

## 2019-10-03 NOTE — Discharge Instructions (Signed)

## 2020-01-20 ENCOUNTER — Other Ambulatory Visit: Payer: Self-pay | Admitting: Orthopedic Surgery

## 2020-01-20 DIAGNOSIS — Z981 Arthrodesis status: Secondary | ICD-10-CM

## 2020-02-10 ENCOUNTER — Ambulatory Visit
Admission: RE | Admit: 2020-02-10 | Discharge: 2020-02-10 | Disposition: A | Discharge status: Home / Self Care | Payer: 59 | Source: Ambulatory Visit | Attending: Orthopedic Surgery | Admitting: Orthopedic Surgery

## 2020-02-10 ENCOUNTER — Other Ambulatory Visit: Payer: Self-pay

## 2020-02-10 DIAGNOSIS — Z981 Arthrodesis status: Secondary | ICD-10-CM

## 2020-02-10 MED ORDER — METHYLPREDNISOLONE ACETATE 40 MG/ML INJ SUSP (RADIOLOG: 120.0000 mg | Freq: Once | INTRAMUSCULAR | Status: DC

## 2020-02-10 MED ORDER — IOPAMIDOL (ISOVUE-M 200) INJECTION 41%: 1.0000 mL | Freq: Once | INTRAMUSCULAR | Status: DC

## 2021-03-14 IMAGING — XA Imaging study
2 series · 4 of 4 positions shown · non-contrast
Comparison: none

CLINICAL DATA: Lumbosacral spondylosis without myelopathy. Low back
pain. Pain and numbness in the legs, right greater than left. L3-4
fusion. Epidural requested at L5-S1.

[Series 1: ortho adipose · 1 of 1 slices shown (1 of 2)]
[im 1/1]
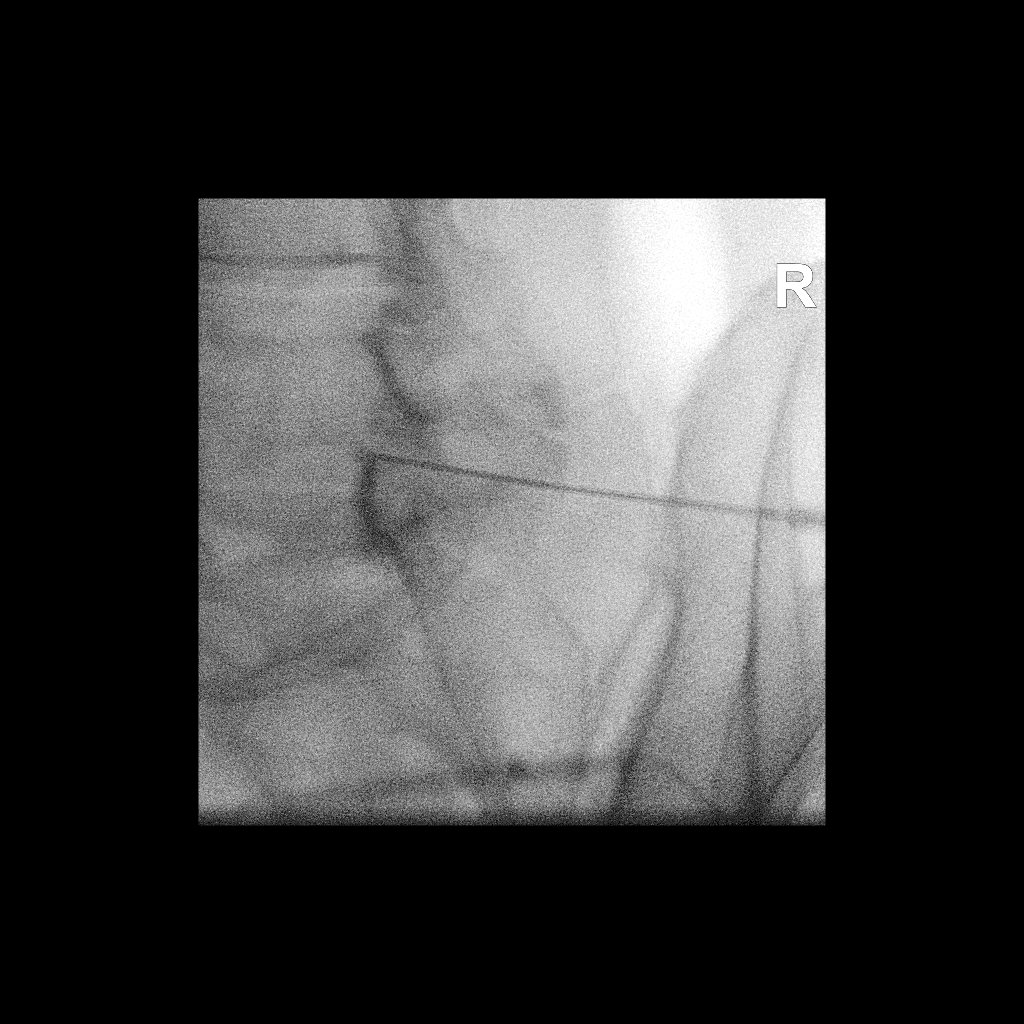

[Series 2: ortho adipose · 3 of 3 frames shown (2 of 2)]
[frame 1/3]
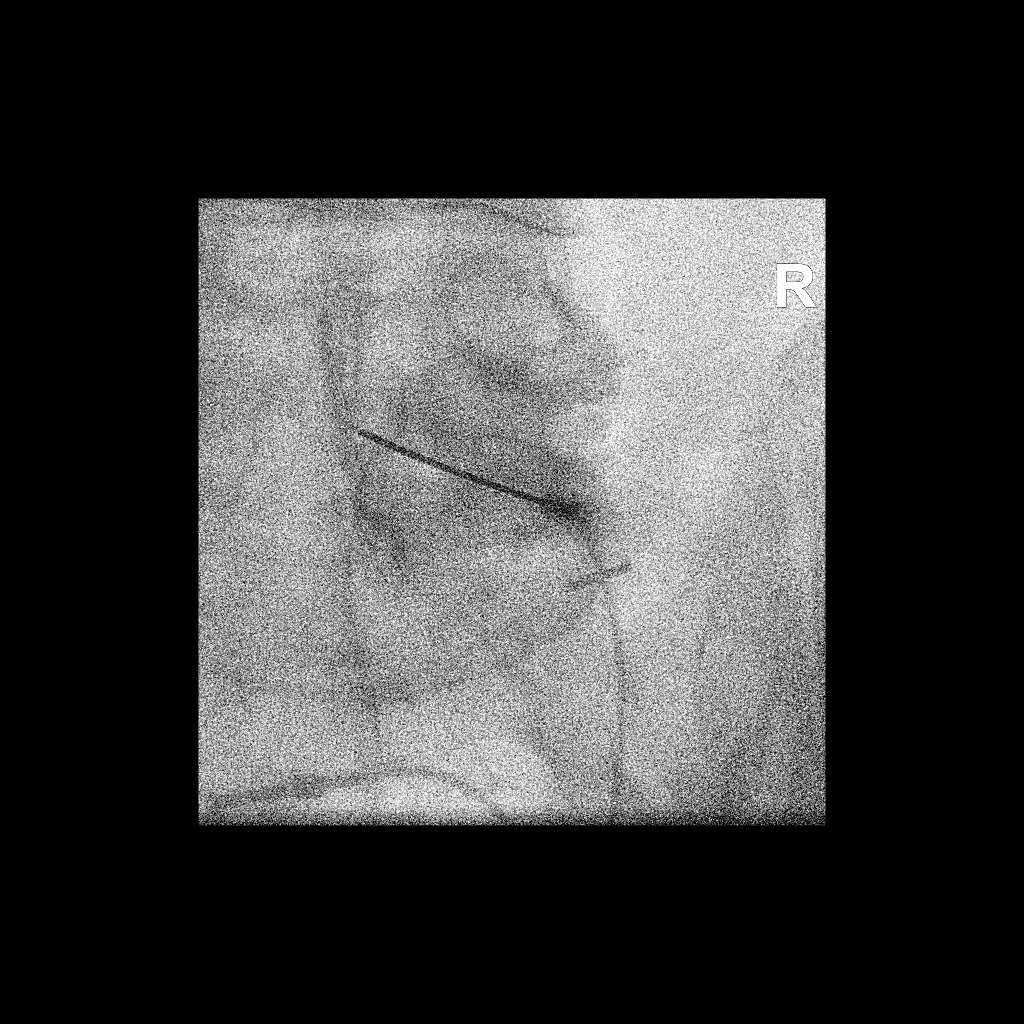
[frame 2/3]
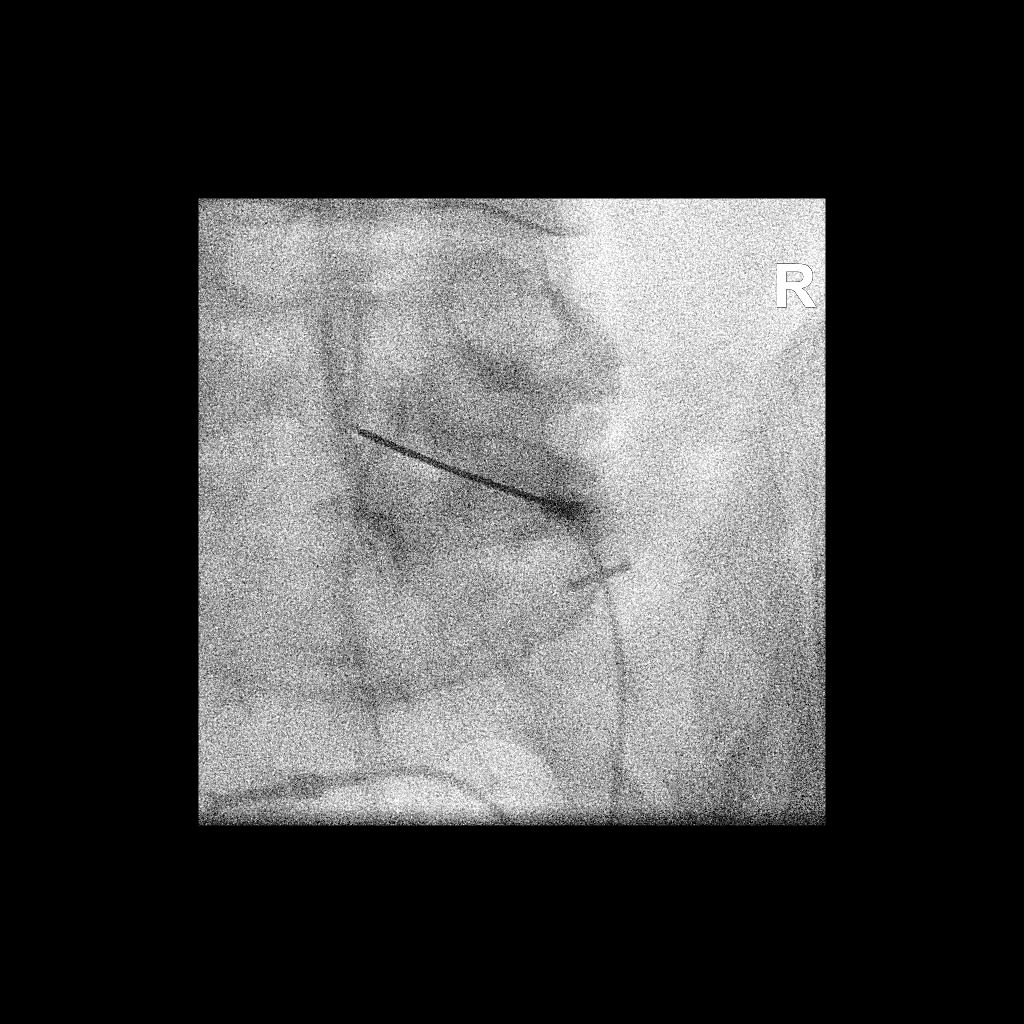
[frame 3/3]
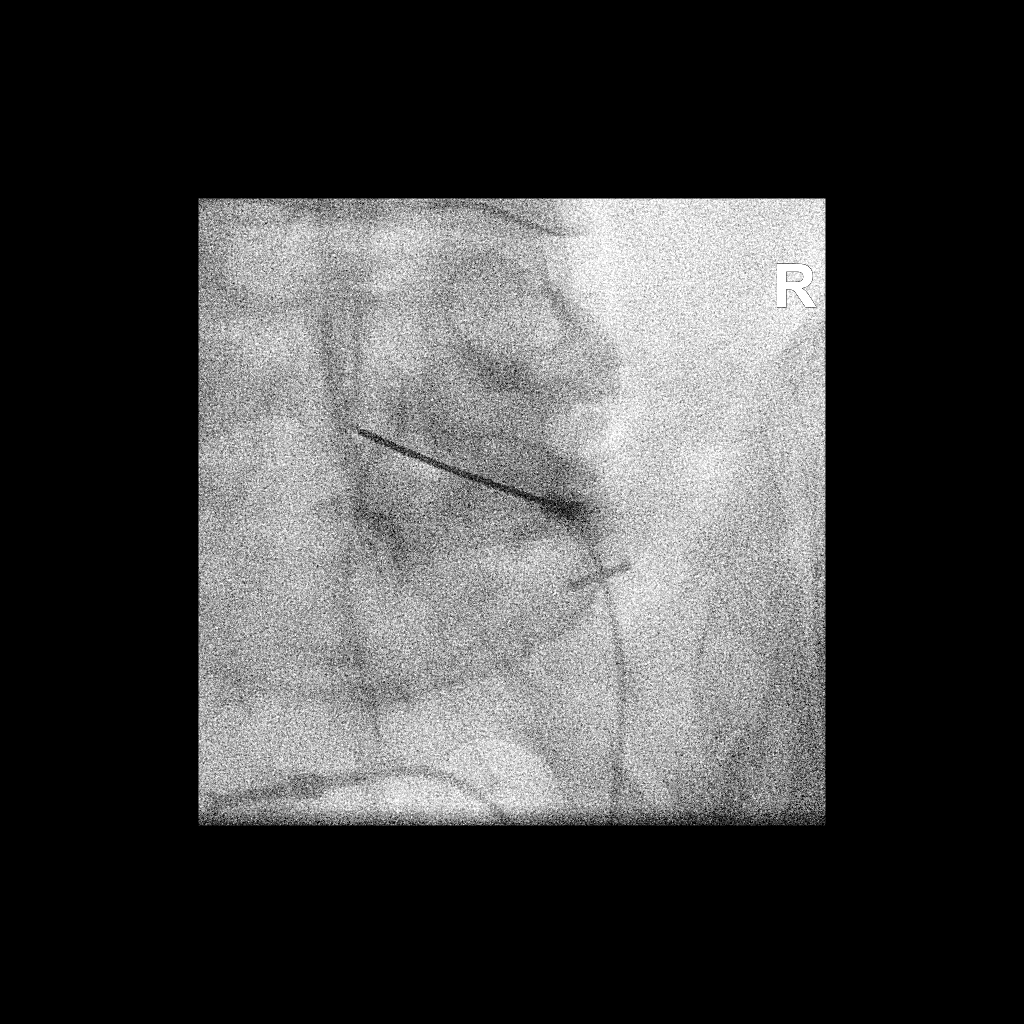

[4 of 4 positions shown; findings below may reference images not displayed]

FLUOROSCOPY TIME:  Fluoroscopy Time: 6 seconds

Radiation Exposure Index: 20.35 microGray*m^2

PROCEDURE:
The procedure, risks, benefits, and alternatives were explained to
the patient. Questions regarding the procedure were encouraged and
answered. The patient understands and consents to the procedure.

LUMBAR EPIDURAL INJECTION:

An interlaminar approach was performed on the right at L5-S1. The
overlying skin was cleansed and anesthetized. A 3.5 inch 20 gauge
epidural needle was advanced using loss-of-resistance technique.

DIAGNOSTIC EPIDURAL INJECTION:

Injection of Isovue-M 200 shows a good epidural pattern with spread
above and below the level of needle placement, primarily on the
right. No vascular opacification is seen.

THERAPEUTIC EPIDURAL INJECTION:

120 mg of Depo-Medrol mixed with 3 mL of 1% lidocaine were
instilled. The procedure was well-tolerated, and the patient was
discharged thirty minutes following the injection in good condition.

COMPLICATIONS:
None
IMPRESSION: Technically successful interlaminar epidural injection on the right
at L5-S1.

## 2021-07-22 IMAGING — XA Imaging study
2 series · 2 of 2 positions shown · non-contrast
Comparison: none

CLINICAL DATA: Spondylosis without myelopathy. Patient got
improvement from the initial injection but it was not long-lasting.
Low back pain extending to both sides, right more than left.

[Series 1: ortho adipose · 1 of 1 slices shown (1 of 2)]
[im 1/1]
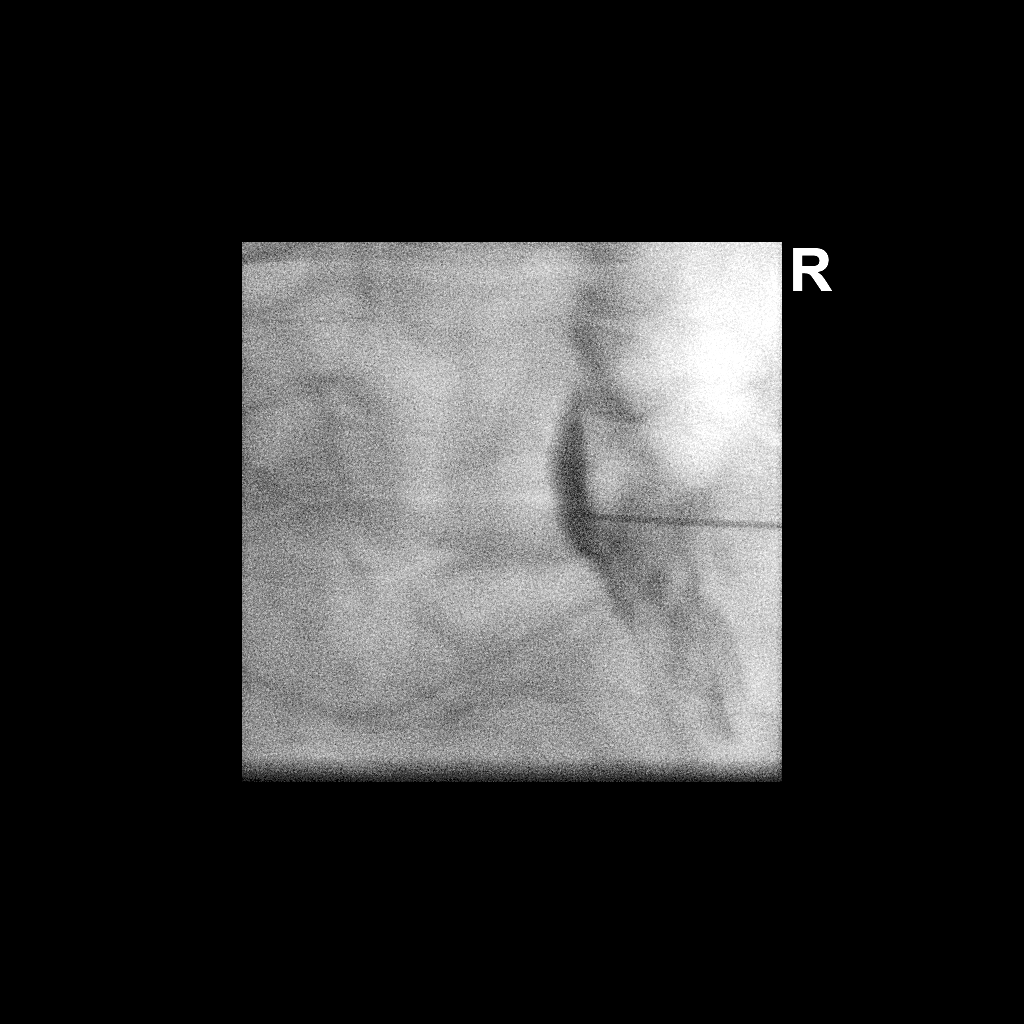

[Series 2: ortho adipose · 1 of 1 slices shown (2 of 2)]
[im 1/1]
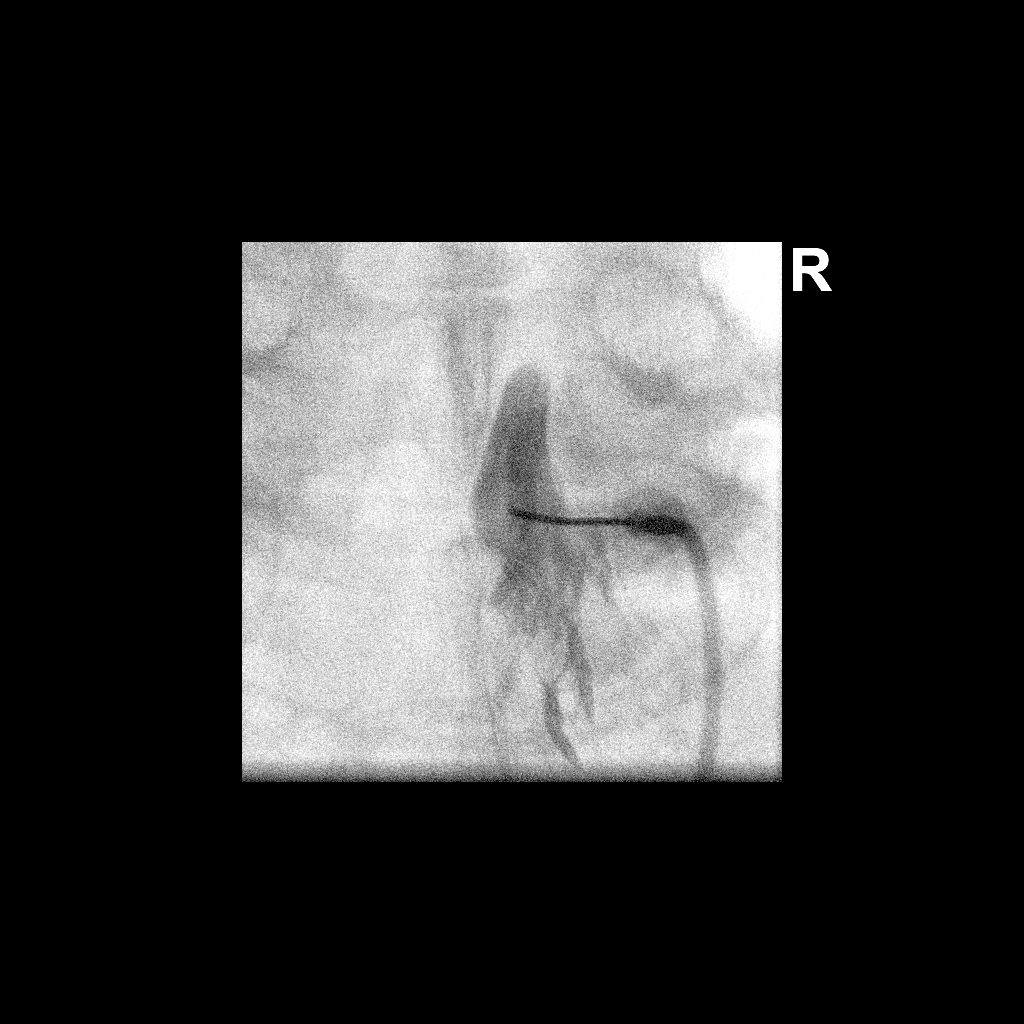

[2 of 2 positions shown; findings below may reference images not displayed]

FLUOROSCOPY TIME:  0 minutes 41 seconds. 50.44 micro gray meter
squared

PROCEDURE:
The procedure, risks, benefits, and alternatives were explained to
the patient. Questions regarding the procedure were encouraged and
answered. The patient understands and consents to the procedure.

LUMBAR EPIDURAL INJECTION:

An interlaminar approach was performed on right at L5-S1. The
overlying skin was cleansed and anesthetized. A 20 gauge epidural
needle was advanced using loss-of-resistance technique.

DIAGNOSTIC EPIDURAL INJECTION:

Injection of Isovue-M 200 shows a good epidural pattern with spread
above and below the level of needle placement, primarily on the
right, but to both sides. No vascular opacification is seen.

THERAPEUTIC EPIDURAL INJECTION:

One hundred twenty mg of Depo-Medrol mixed with 2.5 cc 1% lidocaine
were instilled. The procedure was well-tolerated, and the patient
was discharged thirty minutes following the injection in good
condition.

COMPLICATIONS:
None
IMPRESSION: Technically successful epidural injection on the right at L5-S1

## 2022-05-28 ENCOUNTER — Encounter (HOSPITAL_BASED_OUTPATIENT_CLINIC_OR_DEPARTMENT_OTHER): Payer: Self-pay | Admitting: Urology

## 2022-05-28 ENCOUNTER — Emergency Department (HOSPITAL_BASED_OUTPATIENT_CLINIC_OR_DEPARTMENT_OTHER)
Admission: EM | Admit: 2022-05-28 | Discharge: 2022-05-28 | Disposition: A | Discharge status: Home / Self Care | Payer: Medicare HMO | Attending: Emergency Medicine | Admitting: Emergency Medicine

## 2022-05-28 ENCOUNTER — Emergency Department (HOSPITAL_BASED_OUTPATIENT_CLINIC_OR_DEPARTMENT_OTHER): Payer: Medicare HMO

## 2022-05-28 DIAGNOSIS — R109 Unspecified abdominal pain: Secondary | ICD-10-CM | POA: Diagnosis present

## 2022-05-28 DIAGNOSIS — N201 Calculus of ureter: Secondary | ICD-10-CM

## 2022-05-28 DIAGNOSIS — Z85828 Personal history of other malignant neoplasm of skin: Secondary | ICD-10-CM | POA: Insufficient documentation

## 2022-05-28 DIAGNOSIS — N132 Hydronephrosis with renal and ureteral calculous obstruction: Secondary | ICD-10-CM | POA: Insufficient documentation

## 2022-05-28 LAB — COMPREHENSIVE METABOLIC PANEL
ALT: 25 U/L (ref 0–44)
AST: 23 U/L (ref 15–41)
Albumin: 4 g/dL (ref 3.5–5.0)
Alkaline Phosphatase: 92 U/L (ref 38–126)
Anion gap: 8 (ref 5–15)
BUN: 19 mg/dL (ref 6–20)
CO2: 26 mmol/L (ref 22–32)
Calcium: 9.2 mg/dL (ref 8.9–10.3)
Chloride: 102 mmol/L (ref 98–111)
Creatinine, Ser: 0.96 mg/dL (ref 0.61–1.24)
GFR, Estimated: >60 mL/min (ref >60)
Glucose, Bld: 120 mg/dL — ABNORMAL HIGH (ref 70–99)
Potassium: 4 mmol/L (ref 3.5–5.1)
Sodium: 136 mmol/L (ref 135–145)
Total Bilirubin: 0.2 mg/dL — ABNORMAL LOW (ref 0.3–1.2)
Total Protein: 7 g/dL (ref 6.5–8.1)

## 2022-05-28 LAB — CBC WITH DIFFERENTIAL/PLATELET
Abs Immature Granulocytes: 0.06 10*3/uL (ref 0.00–0.07)
Basophils Absolute: 0.1 10*3/uL (ref 0.0–0.1)
Basophils Relative: 1 %
Eosinophils Absolute: 0.4 10*3/uL (ref 0.0–0.5)
Eosinophils Relative: 4 %
HCT: 45.4 % (ref 39.0–52.0)
Hemoglobin: 16.7 g/dL (ref 13.0–17.0)
Immature Granulocytes: 1 %
Lymphocytes Relative: 31 %
Lymphs Abs: 2.9 10*3/uL (ref 0.7–4.0)
MCH: 31.6 pg (ref 26.0–34.0)
MCHC: 36.8 g/dL — ABNORMAL HIGH (ref 30.0–36.0)
MCV: 86 fL (ref 80.0–100.0)
Monocytes Absolute: 1 10*3/uL (ref 0.1–1.0)
Monocytes Relative: 10 %
Neutro Abs: 5.1 10*3/uL (ref 1.7–7.7)
Neutrophils Relative %: 53 %
Platelets: 357 10*3/uL (ref 150–400)
RBC: 5.28 MIL/uL (ref 4.22–5.81)
RDW: 13.1 % (ref 11.5–15.5)
WBC: 9.5 10*3/uL (ref 4.0–10.5)
nRBC: 0 % (ref 0.0–0.2)

## 2022-05-28 LAB — URINALYSIS, ROUTINE W REFLEX MICROSCOPIC
Bilirubin Urine: NEGATIVE
Glucose, UA: NEGATIVE mg/dL
Ketones, ur: NEGATIVE mg/dL
Leukocytes,Ua: NEGATIVE
Nitrite: NEGATIVE
Protein, ur: 30 mg/dL — AB
Specific Gravity, Urine: >=1.03 (ref 1.005–1.030)
pH: 5.5 (ref 5.0–8.0)

## 2022-05-28 LAB — URINALYSIS, MICROSCOPIC (REFLEX): RBC / HPF: >50 RBC/hpf (ref 0–5)

## 2022-05-28 LAB — LIPASE, BLOOD: Lipase: 33 U/L (ref 11–51)

## 2022-05-28 MED ORDER — KETOROLAC TROMETHAMINE 15 MG/ML IJ SOLN
30.0000 mg | Freq: Once | INTRAMUSCULAR | Status: AC
  Administered 2022-05-28: 30 mg via INTRAVENOUS
  Filled 2022-05-28: qty 2

## 2022-05-28 MED ORDER — HYDROMORPHONE HCL 1 MG/ML IJ SOLN
1.0000 mg | Freq: Once | INTRAMUSCULAR | Status: AC
  Administered 2022-05-28: 1 mg via INTRAVENOUS
  Filled 2022-05-28: qty 1

## 2022-05-28 MED ORDER — ONDANSETRON 4 MG PO TBDP: 4.0000 mg | ORAL_TABLET | Freq: Three times a day (TID) | ORAL | 0 refills | Status: AC | PRN

## 2022-05-28 MED ORDER — OXYCODONE HCL 5 MG PO TABS: 5.0000 mg | ORAL_TABLET | ORAL | 0 refills | Status: DC | PRN

## 2022-05-28 MED ORDER — LACTATED RINGERS IV BOLUS
1000.0000 mL | Freq: Once | INTRAVENOUS | Status: AC
  Administered 2022-05-28: 1000 mL via INTRAVENOUS

## 2022-05-28 MED ORDER — TAMSULOSIN HCL 0.4 MG PO CAPS: 0.4000 mg | ORAL_CAPSULE | Freq: Every day | ORAL | 0 refills | Status: AC

## 2022-05-28 NOTE — ED Provider Notes (Incomplete)
[facility] Provider Note   CSN: [id number] Arrival date & time: 05/28/22  1936     History {Add pertinent medical, surgical, social history, OB history to HPI:1} Chief Complaint  Patient presents with   Flank Pain    Tristan Wood is a 56 y.o. male.  HPI     Past Medical History:  Diagnosis Date   Basal cell carcinoma    GERD (gastroesophageal reflux disease)    High cholesterol    History of kidney stones    Spinal stenosis      Home Medications Prior to Admission medications   Medication Sig Start Date End Date Taking? Authorizing Provider  gabapentin (NEURONTIN) 300 MG capsule Take 300 mg by mouth at bedtime.    [provider]  nicotine (NICODERM CQ - DOSED IN MG/24 HOURS) 21 mg/24hr patch Place 21 mg onto the skin daily.    [provider]  omeprazole (PRILOSEC) 20 MG capsule Take 20 mg by mouth daily.    [provider]  ondansetron (ZOFRAN) 4 MG tablet Take 1 tablet (4 mg total) by mouth every 8 (eight) hours as needed for nausea or vomiting. 06/27/19   Melina Schools, MD  sertraline (ZOLOFT) 50 MG tablet Take 50 mg by mouth daily.    [provider]  zolpidem (AMBIEN CR) 12.5 MG CR tablet Take 12.5 mg by mouth at bedtime as needed for sleep.    [provider]      Allergies    Patient has no known allergies.    Review of Systems   Review of Systems  Physical Exam Updated Vital Signs BP (!) 165/111 (BP Location: Left Arm)   Pulse 69   Temp 98.1 F (36.7 C) (Oral)   Resp 19   Ht 6' (1.829 m)   Wt 117.9 kg   SpO2 94%   BMI 35.26 kg/m  Physical Exam  ED Results / Procedures / Treatments   Labs (all labs ordered are listed, but only abnormal results are displayed) Labs Reviewed  URINALYSIS, ROUTINE W REFLEX MICROSCOPIC - Abnormal; Notable for the following components:      Result Value   APPearance CLOUDY (*)    Hgb urine dipstick LARGE (*)    Protein, ur 30 (*)     All other components within normal limits  CBC WITH DIFFERENTIAL/PLATELET - Abnormal; Notable for the following components:   MCHC 36.8 (*)    All other components within normal limits  URINALYSIS, MICROSCOPIC (REFLEX) - Abnormal; Notable for the following components:   Bacteria, UA FEW (*)    All other components within normal limits  COMPREHENSIVE METABOLIC PANEL  LIPASE, BLOOD    EKG None  Radiology No results found.  Procedures Procedures  {Document cardiac monitor, telemetry assessment procedure when appropriate:1}  Medications Ordered in ED Medications  lactated ringers bolus 1,000 mL (has no administration in time range)  HYDROmorphone (DILAUDID) injection 1 mg (has no administration in time range)    ED Course/ Medical Decision Making/ A&P                           Medical Decision Making Amount and/or Complexity of Data Reviewed Labs: ordered. Radiology: ordered.  Risk Prescription drug management.   ***  {Document critical care time when appropriate:1} {Document review of labs and clinical decision tools ie heart score, Chads2Vasc2 etc:1}  {Document your independent review of radiology images, and any outside records:1} {  Document your discussion with family members, caretakers, and with consultants:1} {Document social determinants of health affecting pt's care:1} {Document your decision making why or why not admission, treatments were needed:1} Final Clinical Impression(s) / ED Diagnoses Final diagnoses:  None    Rx / DC Orders ED Discharge Orders     None

## 2022-05-28 NOTE — ED Notes (Signed)
Called lab to add on urine culture ?

## 2022-05-28 NOTE — ED Notes (Signed)
ED Provider at bedside. 

## 2022-05-28 NOTE — ED Triage Notes (Signed)
Left flank pain x 2 hrs  Denies any blood in urine that is noticed  Took oxycodone per normal home med at 1500, no relief   Hx of kidney stones

## 2022-05-30 LAB — URINE CULTURE: Culture: NO GROWTH

## 2022-06-01 ENCOUNTER — Other Ambulatory Visit: Payer: Self-pay | Admitting: Urology

## 2022-06-03 ENCOUNTER — Other Ambulatory Visit: Payer: Self-pay

## 2022-06-03 ENCOUNTER — Encounter (HOSPITAL_BASED_OUTPATIENT_CLINIC_OR_DEPARTMENT_OTHER): Payer: Self-pay | Admitting: Urology

## 2022-06-03 NOTE — Progress Notes (Signed)
Spoke w/ via phone for pre-op interview--- [place]---- ISTAT, EKG          Lab results------ COVID test -----patient states asymptomatic no test needed Arrive at ------- 0715 NPO after MN NO Solid Food.  Clear liquids from MN until--- 0615 Med rec completed Medications to take morning of surgery ----- Norvasc, Prilosec, Zofran, Flomax Diabetic medication ----- Patient instructed no nail polish to be worn day of surgery Yes Patient instructed to bring photo id and insurance card day of surgery Yes Patient aware to have Driver (ride ) / caregiver for 24 hours after surgery Wife Tristan Wood [phone] Patient Special Instructions ----- Pre-Op special Istructions ----- Patient verbalized understanding of instructions that were given at this phone interview. Yes Patient denies shortness of breath, chest pain, fever, cough at this phone interview. Yes

## 2022-06-14 NOTE — Anesthesia Preprocedure Evaluation (Signed)
Anesthesia Evaluation  Patient identified by MRN, date of birth, ID band Patient awake    Reviewed: Allergy & Precautions, NPO status , Patient's Chart, lab work & pertinent test results  History of Anesthesia Complications Negative for: history of anesthetic complications  Airway Mallampati: III  TM Distance: >3 FB Neck ROM: Full    Dental  (+) Dental Advisory Given   Pulmonary Current Smoker and Patient abstained from smoking.,    Pulmonary exam normal        Cardiovascular hypertension, Pt. on medications Normal cardiovascular exam     Neuro/Psych negative neurological ROS     GI/Hepatic Neg liver ROS, GERD  ,  Endo/Other  negative endocrine ROS  Renal/GU Renal disease (LEFT URETERAL STONE)  negative genitourinary   Musculoskeletal negative musculoskeletal ROS (+)   Abdominal   Peds  Hematology negative hematology ROS (+)   Anesthesia Other Findings   Reproductive/Obstetrics                            Anesthesia Physical Anesthesia Plan  ASA: 2  Anesthesia Plan: General   Post-op Pain Management: Tylenol PO (pre-op)* and Toradol IV (intra-op)*   Induction: Intravenous  PONV Risk Score and Plan: 1 and Ondansetron, Dexamethasone, Midazolam and Treatment may vary due to age or medical condition  Airway Management Planned: LMA  Additional Equipment: None  Intra-op Plan:   Post-operative Plan: Extubation in OR  Informed Consent: I have reviewed the patients History and Physical, chart, labs and discussed the procedure including the risks, benefits and alternatives for the proposed anesthesia with the patient or authorized representative who has indicated his/her understanding and acceptance.     Dental advisory given  Plan Discussed with:   Anesthesia Plan Comments:        Anesthesia Quick Evaluation

## 2022-06-15 ENCOUNTER — Ambulatory Visit (HOSPITAL_BASED_OUTPATIENT_CLINIC_OR_DEPARTMENT_OTHER): Payer: Medicare HMO | Admitting: Anesthesiology

## 2022-06-15 ENCOUNTER — Encounter (HOSPITAL_BASED_OUTPATIENT_CLINIC_OR_DEPARTMENT_OTHER)
Admission: RE | Disposition: A | Discharge status: Home / Self Care | Payer: Self-pay | Source: Ambulatory Visit | Attending: Urology

## 2022-06-15 ENCOUNTER — Encounter (HOSPITAL_BASED_OUTPATIENT_CLINIC_OR_DEPARTMENT_OTHER): Payer: Self-pay | Admitting: Urology

## 2022-06-15 ENCOUNTER — Ambulatory Visit (HOSPITAL_BASED_OUTPATIENT_CLINIC_OR_DEPARTMENT_OTHER)
Admission: RE | Admit: 2022-06-15 | Discharge: 2022-06-15 | Disposition: A | Discharge status: Home / Self Care | Payer: Medicare HMO | Source: Ambulatory Visit | Attending: Urology | Admitting: Urology

## 2022-06-15 ENCOUNTER — Other Ambulatory Visit: Payer: Self-pay

## 2022-06-15 DIAGNOSIS — I10 Essential (primary) hypertension: Secondary | ICD-10-CM | POA: Diagnosis not present

## 2022-06-15 DIAGNOSIS — F17210 Nicotine dependence, cigarettes, uncomplicated: Secondary | ICD-10-CM | POA: Insufficient documentation

## 2022-06-15 DIAGNOSIS — Z79899 Other long term (current) drug therapy: Secondary | ICD-10-CM | POA: Diagnosis not present

## 2022-06-15 DIAGNOSIS — N201 Calculus of ureter: Secondary | ICD-10-CM | POA: Insufficient documentation

## 2022-06-15 DIAGNOSIS — K219 Gastro-esophageal reflux disease without esophagitis: Secondary | ICD-10-CM | POA: Insufficient documentation

## 2022-06-15 HISTORY — DX: Essential (primary) hypertension: I10

## 2022-06-15 HISTORY — PX: CYSTOSCOPY/URETEROSCOPY/HOLMIUM LASER/STENT PLACEMENT: SHX6546

## 2022-06-15 LAB — POCT I-STAT, CHEM 8
BUN: 14 mg/dL (ref 6–20)
Calcium, Ion: 1.21 mmol/L (ref 1.15–1.40)
Chloride: 101 mmol/L (ref 98–111)
Creatinine, Ser: 0.8 mg/dL (ref 0.61–1.24)
Glucose, Bld: 97 mg/dL (ref 70–99)
HCT: 47 % (ref 39.0–52.0)
Hemoglobin: 16 g/dL (ref 13.0–17.0)
Potassium: 4.2 mmol/L (ref 3.5–5.1)
Sodium: 137 mmol/L (ref 135–145)
TCO2: 24 mmol/L (ref 22–32)

## 2022-06-15 SURGERY — CYSTOSCOPY/URETEROSCOPY/HOLMIUM LASER/STENT PLACEMENT
Anesthesia: General | Site: Renal | Laterality: Left

## 2022-06-15 MED ORDER — DEXAMETHASONE SODIUM PHOSPHATE 10 MG/ML IJ SOLN
INTRAMUSCULAR | Status: AC
  Filled 2022-06-15: qty 1

## 2022-06-15 MED ORDER — ONDANSETRON HCL 4 MG/2ML IJ SOLN
INTRAMUSCULAR | Status: DC | PRN
  Administered 2022-06-15: 4 mg via INTRAVENOUS

## 2022-06-15 MED ORDER — KETOROLAC TROMETHAMINE 30 MG/ML IJ SOLN
INTRAMUSCULAR | Status: DC | PRN
  Administered 2022-06-15: 30 mg via INTRAVENOUS

## 2022-06-15 MED ORDER — FENTANYL CITRATE (PF) 100 MCG/2ML IJ SOLN
INTRAMUSCULAR | Status: DC | PRN
  Administered 2022-06-15 (×4): 50 ug via INTRAVENOUS

## 2022-06-15 MED ORDER — NITROFURANTOIN MONOHYD MACRO 100 MG PO CAPS: 100.0000 mg | ORAL_CAPSULE | Freq: Two times a day (BID) | ORAL | 0 refills | Status: AC

## 2022-06-15 MED ORDER — ACETAMINOPHEN 500 MG PO TABS
ORAL_TABLET | ORAL | Status: AC
  Filled 2022-06-15: qty 2

## 2022-06-15 MED ORDER — FENTANYL CITRATE (PF) 100 MCG/2ML IJ SOLN: 25.0000 ug | INTRAMUSCULAR | Status: DC | PRN

## 2022-06-15 MED ORDER — OXYCODONE HCL 5 MG PO TABS: 5.0000 mg | ORAL_TABLET | Freq: Once | ORAL | Status: DC | PRN

## 2022-06-15 MED ORDER — CEFAZOLIN SODIUM-DEXTROSE 2-4 GM/100ML-% IV SOLN
2.0000 g | Freq: Once | INTRAVENOUS | Status: AC
  Administered 2022-06-15: 2 g via INTRAVENOUS

## 2022-06-15 MED ORDER — LACTATED RINGERS IV SOLN: INTRAVENOUS | Status: DC

## 2022-06-15 MED ORDER — HYDROMORPHONE HCL 1 MG/ML IJ SOLN
INTRAMUSCULAR | Status: AC
  Filled 2022-06-15: qty 1

## 2022-06-15 MED ORDER — IOHEXOL 300 MG/ML  SOLN
INTRAMUSCULAR | Status: DC | PRN
  Administered 2022-06-15: 18 mL

## 2022-06-15 MED ORDER — CELECOXIB 200 MG PO CAPS: 200.0000 mg | ORAL_CAPSULE | Freq: Two times a day (BID) | ORAL | 0 refills | Status: AC | PRN

## 2022-06-15 MED ORDER — KETOROLAC TROMETHAMINE 30 MG/ML IJ SOLN
INTRAMUSCULAR | Status: AC
  Filled 2022-06-15: qty 1

## 2022-06-15 MED ORDER — SODIUM CHLORIDE 0.9 % IR SOLN
Status: DC | PRN
  Administered 2022-06-15: 3000 mL

## 2022-06-15 MED ORDER — CEFAZOLIN SODIUM-DEXTROSE 2-4 GM/100ML-% IV SOLN
INTRAVENOUS | Status: AC
  Filled 2022-06-15: qty 100

## 2022-06-15 MED ORDER — PROPOFOL 10 MG/ML IV BOLUS
INTRAVENOUS | Status: AC
  Filled 2022-06-15: qty 20

## 2022-06-15 MED ORDER — AMISULPRIDE (ANTIEMETIC) 5 MG/2ML IV SOLN
10.0000 mg | Freq: Once | INTRAVENOUS | Status: DC | PRN
Start: 2022-06-15 — End: 2022-06-15

## 2022-06-15 MED ORDER — TAMSULOSIN HCL 0.4 MG PO CAPS
0.4000 mg | ORAL_CAPSULE | Freq: Every day | ORAL | 0 refills | Status: DC
Start: 2022-06-15 — End: 2024-03-16

## 2022-06-15 MED ORDER — ARTIFICIAL TEARS OPHTHALMIC OINT
TOPICAL_OINTMENT | OPHTHALMIC | Status: AC
  Filled 2022-06-15: qty 3.5

## 2022-06-15 MED ORDER — OXYCODONE HCL 5 MG/5ML PO SOLN: 5.0000 mg | Freq: Once | ORAL | Status: DC | PRN

## 2022-06-15 MED ORDER — HYDROMORPHONE HCL 1 MG/ML IJ SOLN
0.2500 mg | INTRAMUSCULAR | Status: DC | PRN
  Administered 2022-06-15 (×2): 0.25 mg via INTRAVENOUS
  Administered 2022-06-15: 0.5 mg via INTRAVENOUS

## 2022-06-15 MED ORDER — LIDOCAINE 2% (20 MG/ML) 5 ML SYRINGE
INTRAMUSCULAR | Status: DC | PRN
  Administered 2022-06-15: 100 mg via INTRAVENOUS

## 2022-06-15 MED ORDER — FENTANYL CITRATE (PF) 100 MCG/2ML IJ SOLN
INTRAMUSCULAR | Status: AC
  Filled 2022-06-15: qty 2

## 2022-06-15 MED ORDER — ONDANSETRON HCL 4 MG/2ML IJ SOLN
INTRAMUSCULAR | Status: AC
  Filled 2022-06-15: qty 2

## 2022-06-15 MED ORDER — ONDANSETRON HCL 4 MG/2ML IJ SOLN
4.0000 mg | Freq: Once | INTRAMUSCULAR | Status: AC
  Administered 2022-06-15: 4 mg via INTRAVENOUS

## 2022-06-15 MED ORDER — PROPOFOL 10 MG/ML IV BOLUS
INTRAVENOUS | Status: DC | PRN
  Administered 2022-06-15: 200 mg via INTRAVENOUS

## 2022-06-15 MED ORDER — MIDAZOLAM HCL 5 MG/5ML IJ SOLN
INTRAMUSCULAR | Status: DC | PRN
  Administered 2022-06-15: 2 mg via INTRAVENOUS

## 2022-06-15 MED ORDER — OXYCODONE-ACETAMINOPHEN 10-325 MG PO TABS: 1.0000 | ORAL_TABLET | Freq: Four times a day (QID) | ORAL | 0 refills | Status: DC | PRN

## 2022-06-15 MED ORDER — MIDAZOLAM HCL 2 MG/2ML IJ SOLN
INTRAMUSCULAR | Status: AC
  Filled 2022-06-15: qty 2

## 2022-06-15 MED ORDER — ONDANSETRON HCL 4 MG/2ML IJ SOLN
4.0000 mg | Freq: Once | INTRAMUSCULAR | Status: DC | PRN
Start: 2022-06-15 — End: 2022-06-15

## 2022-06-15 MED ORDER — LIDOCAINE HCL (PF) 2 % IJ SOLN
INTRAMUSCULAR | Status: AC
  Filled 2022-06-15: qty 5

## 2022-06-15 MED ORDER — ACETAMINOPHEN 500 MG PO TABS
1000.0000 mg | ORAL_TABLET | Freq: Once | ORAL | Status: AC
  Administered 2022-06-15: 1000 mg via ORAL

## 2022-06-15 MED ORDER — DEXAMETHASONE SODIUM PHOSPHATE 10 MG/ML IJ SOLN
INTRAMUSCULAR | Status: DC | PRN
  Administered 2022-06-15: 10 mg via INTRAVENOUS

## 2022-06-15 SURGICAL SUPPLY — 31 items
APL SKNCLS STERI-STRIP NONHPOA (GAUZE/BANDAGES/DRESSINGS) ×1
BAG DRAIN URO-CYSTO SKYTR STRL (DRAIN) ×2 IMPLANT
BAG DRN UROCATH (DRAIN) ×1
BASKET ZERO TIP NITINOL 2.4FR (BASKET) ×1 IMPLANT
BENZOIN TINCTURE PRP APPL 2/3 (GAUZE/BANDAGES/DRESSINGS) ×1 IMPLANT
BSKT STON RTRVL ZERO TP 2.4FR (BASKET) ×1
CATH INTERMIT  6FR 70CM (CATHETERS) ×2 IMPLANT
CATH URET DUAL LUMEN 6-10FR 50 (CATHETERS) ×1 IMPLANT
CLOTH BEACON ORANGE TIMEOUT ST (SAFETY) ×2 IMPLANT
COVER DOME SNAP 22 D (MISCELLANEOUS) ×2 IMPLANT
DRSG TEGADERM 2-3/8X2-3/4 SM (GAUZE/BANDAGES/DRESSINGS) ×1 IMPLANT
ELECT REM PT RETURN 9FT ADLT (ELECTROSURGICAL)
ELECTRODE REM PT RTRN 9FT ADLT (ELECTROSURGICAL) IMPLANT
FIBER LASER FLEXIVA 365 (UROLOGICAL SUPPLIES) IMPLANT
GLOVE BIO SURGEON STRL SZ7 (GLOVE) ×2 IMPLANT
GLOVE BIO SURGEON STRL SZ8 (GLOVE) ×2 IMPLANT
GOWN STRL REUS W/TWL LRG LVL3 (GOWN DISPOSABLE) ×2 IMPLANT
GUIDEWIRE ANG ZIPWIRE 038X150 (WIRE) ×1 IMPLANT
GUIDEWIRE STR DUAL SENSOR (WIRE) ×1 IMPLANT
IV NS IRRIG 3000ML ARTHROMATIC (IV SOLUTION) ×4 IMPLANT
KIT TURNOVER CYSTO (KITS) ×2 IMPLANT
MANIFOLD NEPTUNE II (INSTRUMENTS) ×2 IMPLANT
NS IRRIG 500ML POUR BTL (IV SOLUTION) ×2 IMPLANT
PACK CYSTO (CUSTOM PROCEDURE TRAY) ×2 IMPLANT
SHEATH URETERAL 12FRX35CM (MISCELLANEOUS) ×1 IMPLANT
SHEATH URETERAL 12FRX55CM (UROLOGICAL SUPPLIES) ×1 IMPLANT
STENT URET 6FRX26 CONTOUR (STENTS) ×1 IMPLANT
TRACTIP FLEXIVA PULS ID 200XHI (Laser) IMPLANT
TRACTIP FLEXIVA PULSE ID 200 (Laser) ×2
TUBE CONNECTING 12X1/4 (SUCTIONS) IMPLANT
TUBING UROLOGY SET (TUBING) IMPLANT

## 2022-06-15 NOTE — Addendum Note (Signed)
Addendum  created 06/15/22 1453 by Lidia Collum, MD   Clinical Note Signed

## 2022-06-15 NOTE — Op Note (Signed)
Operative Note  Preoperative diagnosis:  1.  Left ureteral stone  Postoperative diagnosis: 1.  Left ureteral stone  Procedure(s): 1.  Left ureteroscopy with laser lithotripsy and basket extraction of stones 2. Cystoscopy  3. Left retrograde pyelogram 4. Left ureteral stent placement 6x26 5. Fluoroscopy with intraoperative interpretation  Surgeon: Donald Pore, MD  Assistants:  None  Anesthesia:  General  Complications:  None  EBL:  Minimal  Specimens: 1. stones for stone analysis (to be done at [facility])  Drains/Catheters: 1.  Left 6Fr x 26cm ureteral stent with tether string  Intraoperative findings:   Cystoscopy demonstrated unremarkable bladder Ureteroscopy demonstrated stone in left proximal ureter Successful stent placement.  Indication:  Tristan Wood is a 56 y.o. male with a symptomatic left ureteral stone who elected to proceed with ureteroscopy with laser litho for definitive treatment  Description of procedure: After informed consent was obtained from the patient, the patient was identified and taken to the operating room and placed in the supine position.  General anesthesia was administered as well as perioperative IV antibiotics.  At the beginning of the case, a time-out was performed to properly identify the patient, the surgery to be performed, and the surgical site.  Sequential compression devices were applied to the lower extremities at the beginning of the case for DVT prophylaxis.  The patient was then placed in the dorsal lithotomy supine position, prepped and draped in sterile fashion.  Preliminary scout fluoroscopy revealed that there was a 54m calcification area at the left proximal ureter, which corresponds to the  stone found on the preoperative CT scan. We then passed the 21-French rigid cystoscope through the urethra and into the bladder under vision without any difficulty, noting a normal urethra without strictures and a some mild bilobar  enlargement of his prostate.  A systematic evaluation of the bladder revealed no evidence of any suspicious bladder lesions.  Ureteral orifices were in normal position.    A glide wire was placed up the left ureter with the aid of an open ended ureteral catheter.  The cystoscope was withdrawn, and a dual lumen catheter was inserted over the glide wire into the distal ureter. A gentle retrograde pyelogram was performed, revealing mild hydronephrosis of the collecting system. A 0.038 sensor wire was then passed up to the level of the renal pelvis. The dual lumen was removed.  An 11/13Fr ureteral access sheath was carefully advanced up the ureter to the level of the UPJ over the sensor wire under fluoroscopic guidance. The flexible ureteroscope was advanced into the collecting system via the access sheath. The collecting system was inspected. The calculus was identified. Using the 272 micron holmium laser fiber, the stone was fragmented completely. A 2.2 Fr zero tip basket was used to remove the fragments under visual guidance. These were sent for chemical analysis. With the ureteroscope in the kidney, a gentle pyelogram was performed to delineate the calyceal system and we evaluated the calyces systematically. We encountered no further stone fragments.. The calyces were re-inspected and there were no significant stone fragment residual.   We then withdrew the ureteroscope back down the ureter along with the access sheath, noting no evidence of any stones along the course of the ureter.  Once the ureteroscope was removed, we then used the Glidewire under fluoroscopic guidance and passed up a 6-French x 26 cm double-pigtail ureteral stent up the ureter, making sure that the proximal and distal ends coiled within the kidney and bladder respectively.  Note  that we left a tether string attached to the distal end of the ureteral stent and it exited the urethral meatus and was secured to the penile shaft.  The  cystoscope was then advanced back into the bladder under vision.  We were able to see the distal stent coiling nicely within the bladder.  The bladder was then emptied with irrigation solution.  The cystoscope was then removed.    The patient tolerated the procedure well and there was no complication. Patient was awoken from anesthesia and taken to the recovery room in stable condition. I was present and scrubbed for the entirety of the case.  Plan:  Patient will be discharged home and can remove stent in 2 days. He should f/u in 6 weeks with renal ultrasound   Daryll Brod MD [facility]  Pager: [phone]

## 2022-06-15 NOTE — H&P (Signed)
H&P  History of Present Illness: Tristan Wood is a 56 y.o. year old with a symptomatic 7 mm left ureteral stone who presents today for left ureteroscopy with laser lithotripsy  Past Medical History:  Diagnosis Date   Basal cell carcinoma    GERD (gastroesophageal reflux disease)    High cholesterol    History of kidney stones    Hypertension    Spinal stenosis     Past Surgical History:  Procedure Laterality Date   ANTERIOR LAT LUMBAR FUSION N/A 06/27/2019   Procedure: Anterior lateral interbody fusion LUMBAR THREE-FOUR with posterior spinal fusion interbody LUMBAR THREE-FOUR;  Surgeon: Melina Schools, MD;  Location: [facility];  Service: Orthopedics;  Laterality: N/A;  4.5 hrs for both procedures   BACK SURGERY     discectomy L3   CYSTOSCOPY WITH RETROGRADE PYELOGRAM, URETEROSCOPY AND STENT PLACEMENT Right 08/29/2014   Procedure: CYSTOSCOPY WITH RETROGRADE PYELOGRAM, URETEROSCOPY, STONE REMOVAL;  Surgeon: Festus Aloe, MD;  Location: [facility];  Service: Urology;  Laterality: Right;   CYSTOSCOPY WITH RETROGRADE PYELOGRAM, URETEROSCOPY AND STENT PLACEMENT Left 09/18/2015   Procedure: CYSTOSCOPY WITH LEFT URETEROSCOPY LEFT RETROGRADE PYELOGRAM ;  Surgeon: Festus Aloe, MD;  Location: [facility];  Service: Urology;  Laterality: Left;   MOHS SURGERY      Home Medications:  Current Meds  Medication Sig   amLODipine (NORVASC) 5 MG tablet Take 5 mg by mouth daily.   ergocalciferol (VITAMIN D2) 1.25 MG (50000 UT) capsule Take 50,000 Units by mouth once a week.   omeprazole (PRILOSEC) 20 MG capsule Take 20 mg by mouth daily.   ondansetron (ZOFRAN-ODT) 4 MG disintegrating tablet Take 1 tablet (4 mg total) by mouth every 8 (eight) hours as needed for nausea or vomiting.   oxyCODONE-acetaminophen (PERCOCET) 10-325 MG tablet Take 1 tablet by mouth every 8 (eight) hours as needed for pain.   pregabalin (LYRICA) 50 MG capsule Take 50 mg by mouth 3 (three) times daily.     Allergies: No Known Allergies  History reviewed. No pertinent family history.  Social History:  reports that he has been smoking cigarettes. He has a 30.00 pack-year smoking history. He has never used smokeless tobacco. He reports that he does not drink alcohol and does not use drugs.  ROS: A complete review of systems was performed.  All systems are negative except for pertinent findings as noted.  Physical Exam:  Vital signs in last 24 hours: Temp:  [97.7 F (36.5 C)] 97.7 F (36.5 C) (07/19 0726) Pulse Rate:  [87] 87 (07/19 0726) Resp:  [18] 18 (07/19 0726) BP: (146)/(102) 146/102 (07/19 0726) SpO2:  [99 %] 99 % (07/19 0726) Weight:  [121.9 kg] 121.9 kg (07/19 0726) Constitutional:  Alert and oriented, No acute distress Cardiovascular: Regular rate and rhythm, No JVD Respiratory: Normal respiratory effort, Lungs clear bilaterally GI: Abdomen is soft, nontender, nondistended, no abdominal masses GU: No CVA tenderness Lymphatic: No lymphadenopathy Neurologic: Grossly intact, no focal deficits Psychiatric: Normal mood and affect Drains/Tubes: none   Laboratory Data:  Recent Labs    06/15/22 0805  HGB 16.0  HCT 47.0    Recent Labs    06/15/22 0805  NA 137  K 4.2  CL 101  GLUCOSE 97  BUN 14  CREATININE 0.80     Results for orders placed or performed during the hospital encounter of 06/15/22 (from the past 24 hour(s))  I-STAT, chem 8     Status: None   Collection Time: 06/15/22  8:05 AM  Result Value Ref Range   Sodium 137 135 - 145 mmol/L   Potassium 4.2 3.5 - 5.1 mmol/L   Chloride 101 98 - 111 mmol/L   BUN 14 6 - 20 mg/dL   Creatinine, Ser 0.80 0.61 - 1.24 mg/dL   Glucose, Bld 97 70 - 99 mg/dL   Calcium, Ion 1.21 1.15 - 1.40 mmol/L   TCO2 24 22 - 32 mmol/L   Hemoglobin 16.0 13.0 - 17.0 g/dL   HCT 47.0 39.0 - 52.0 %   No results found for this or any previous visit (from the past 240 hour(s)).  Renal Function: Recent Labs    06/15/22 0805   CREATININE 0.80   Estimated Creatinine Clearance: 137.3 mL/min (by C-G formula based on SCr of 0.8 mg/dL).  Radiologic Imaging: No results found.  Assessment:  56 yo M with proximal left ureteral stone  Plan:  To OR as planned for ureteroscopy with laser litho, stent placement. We reviewed operation and risks in detail, and all questions answered. We discussed there is a risk I would not be able to safely reach the stone today, in which case a stent would be placed and plans made for staged ureteroscopy. Patient voiced understanding  Donald Pore, MD 06/15/2022, 8:29 AM  [facility] Pager: [phone]

## 2022-06-15 NOTE — Discharge Instructions (Addendum)
[facility] [phone] Post Ureteroscopy With or Without Stent Instructions  *remove stent by pulling on string Friday (7/21) morning  Definitions:  Ureter: The duct that transports urine from the kidney to the bladder. Stent:   A plastic hollow tube that is placed into the ureter, from the kidney to the bladder to prevent the ureter from swelling shut.  GENERAL INSTRUCTIONS:  Despite the fact that no skin incisions were used, the area around the ureter and bladder is raw and irritated. The stent is a foreign body which will further irritate the bladder wall. This irritation is manifested by increased frequency of urination, both day and night, and by an increase in the urge to urinate. In some, the urge to urinate is present almost always. Sometimes the urge is strong enough that you may not be able to stop yourself from urinating. The only real cure is to remove the stent and then give time for the bladder wall to heal which can't be done until the danger of the ureter swelling shut has passed, which varies.  You may see some blood in your urine while the stent is in place and a few days afterwards. Do not be alarmed, even if the urine was clear for a while. Get off your feet and drink lots of fluids until clearing occurs. If you start to pass clots or don't improve, call us.  DIET: You may return to your normal diet immediately. Because of the raw surface of your bladder, alcohol, spicy foods, acid type foods and drinks with caffeine may cause irritation or frequency and should be used in moderation. To keep your urine flowing freely and to avoid constipation, drink plenty of fluids during the day ( 8-10 glasses ). Tip: Avoid cranberry juice because it is very acidic.  ACTIVITY: Your physical activity doesn't need to be restricted. However, if you are very active, you may see some blood in your urine. We suggest that you reduce your activity under these circumstances until  the bleeding has stopped.  BOWELS: It is important to keep your bowels regular during the postoperative period. Straining with bowel movements can cause bleeding. A bowel movement every other day is reasonable. Use a mild laxative if needed, such as Milk of Magnesia 2-3 tablespoons, or 2 Dulcolax tablets. Call if you continue to have problems. If you have been taking narcotics for pain, before, during or after your surgery, you may be constipated. Take a laxative if necessary.   MEDICATION: You should resume your pre-surgery medications unless told not to. In addition you will often be given an antibiotic to prevent infection and likely several as needed medications for stent related discomfort. These should be taken as prescribed until the bottles are finished unless you are having an unusual reaction to one of the drugs.  PROBLEMS YOU SHOULD REPORT TO [place]: Fevers over 100.5 Fahrenheit. Heavy bleeding, or clots ( See above notes about blood in urine ). Inability to urinate. Drug reactions ( hives, rash, nausea, vomiting, diarrhea ). Severe burning or pain with urination that is not improving.   No acetaminophen/Tylenol until after 2:00pm today if needed for pain.   No ibuprofen, Advil, Aleve, Motrin, ketorolac, meloxicam, naproxen, or other NSAIDS until after 4:00 pm today if needed for pain.        Post Anesthesia Home Care Instructions  Activity: Get plenty of rest for the remainder of the day. A responsible individual must stay with you for 24 hours following the procedure.  For the next 24 hours, DO NOT: -Drive a car -Paediatric nurse -Drink alcoholic beverages -Take any medication unless instructed by your physician -Make any legal decisions or sign important papers.  Meals: Start with liquid foods such as gelatin or soup. Progress to regular foods as tolerated. Avoid greasy, spicy, heavy foods. If nausea and/or vomiting occur, drink only clear liquids until the nausea  and/or vomiting subsides. Call your physician if vomiting continues.  Special Instructions/Symptoms: Your throat may feel dry or sore from the anesthesia or the breathing tube placed in your throat during surgery. If this causes discomfort, gargle with warm salt water. The discomfort should disappear within 24 hours.

## 2022-06-15 NOTE — Anesthesia Procedure Notes (Signed)
Procedure Name: LMA Insertion Date/Time: 06/15/2022 9:21 AM  Performed by: Rogers Blocker, CRNAPre-anesthesia Checklist: Patient identified, Emergency Drugs available, Suction available and Patient being monitored Patient Re-evaluated:Patient Re-evaluated prior to induction Oxygen Delivery Method: Circle System Utilized Preoxygenation: Pre-oxygenation with 100% oxygen Induction Type: IV induction Ventilation: Mask ventilation without difficulty LMA: LMA with gastric port inserted LMA Size: 5.0 Number of attempts: 1 Placement Confirmation: positive ETCO2 Tube secured with: Tape Dental Injury: Teeth and Oropharynx as per pre-operative assessment

## 2022-06-15 NOTE — Anesthesia Postprocedure Evaluation (Addendum)
Anesthesia Post Note  Patient: Tristan Wood  Procedure(s) Performed: LEFT URETEROSCOPY/HOLMIUM LASER LITHOTRIPSY/STENT PLACEMENT/retrograde pyelogram (Left: Renal)     Patient location during evaluation: PACU Anesthesia Type: General Level of consciousness: awake and alert Pain management: pain level controlled Vital Signs Assessment: post-procedure vital signs reviewed and stable Respiratory status: spontaneous breathing, nonlabored ventilation and respiratory function stable Cardiovascular status: blood pressure returned to baseline and stable Postop Assessment: no apparent nausea or vomiting Anesthetic complications: no Comments: Had episode of A fib immediately after surgery, rate 90s-110s. At the time patient was also experiencing significant surgical pain. A fib resolved on its own. Discussed with cardiology- no intervention recommended at this time, and they will arrange for outpatient followup.    No notable events documented.  Last Vitals:  Vitals:   06/15/22 1150 06/15/22 1200  BP: (!) 173/105 (!) 144/89  Pulse: 73   Resp: 12   Temp: 36.5 C   SpO2: 99%     Last Pain:  Vitals:   06/15/22 1150  TempSrc:   PainSc: 8                  Mellody Masri E Marrissa Dai

## 2022-06-15 NOTE — Progress Notes (Signed)
C/O nausea. Dr. Christella Hartigan aware. Order given for Zofran.

## 2022-06-15 NOTE — Transfer of Care (Signed)
Immediate Anesthesia Transfer of Care Note  Patient: Tristan Wood  Procedure(s) Performed: LEFT URETEROSCOPY/HOLMIUM LASER LITHOTRIPSY/STENT PLACEMENT/retrograde pyelogram (Left: Renal)  Patient Location: PACU  Anesthesia Type:General  Level of Consciousness: awake, alert , oriented and patient cooperative  Airway & Oxygen Therapy: Patient Spontanous Breathing and Patient connected to face mask oxygen  Post-op Assessment: Report given to RN and Post -op Vital signs reviewed and stable  Post vital signs: Reviewed and stable  Last Vitals:  Vitals Value Taken Time  BP 131/118 06/15/22 1015  Temp    Pulse 107 06/15/22 1017  Resp 12 06/15/22 1017  SpO2 97 % 06/15/22 1017  Vitals shown include unvalidated device data.  Last Pain:  Vitals:   06/15/22 0813  TempSrc:   PainSc: 10-Worst pain ever      Patients Stated Pain Goal: 8 (88/32/54 9826)  Complications: No notable events documented.

## 2022-06-16 ENCOUNTER — Encounter (HOSPITAL_BASED_OUTPATIENT_CLINIC_OR_DEPARTMENT_OTHER): Payer: Self-pay | Admitting: Urology

## 2022-07-04 NOTE — Progress Notes (Deleted)
Cardiology Office Note:    Date:  07/04/2022   ID:  Tristan Wood, DOB September 24, 1966, MRN [id number]  PCP:  London Pepper, MD   [place] Providers Cardiologist:  None { Click to update primary MD,subspecialty MD or APP then REFRESH:1}    Referring MD: London Pepper, MD   No chief complaint on file. ***  History of Present Illness:    Tristan Wood is a 56 y.o. male with a hx of GERD, HTN, L ureteroscopy with laser lithotripsy s/p L ureteral stent, EKG 7/19 with poor baseline, irregular narrow complex rhythm, looks to be some p waves, noted as afib  Past Medical History:  Diagnosis Date   Basal cell carcinoma    GERD (gastroesophageal reflux disease)    High cholesterol    History of kidney stones    Hypertension    Spinal stenosis     Past Surgical History:  Procedure Laterality Date   ANTERIOR LAT LUMBAR FUSION N/A 06/27/2019   Procedure: Anterior lateral interbody fusion LUMBAR THREE-FOUR with posterior spinal fusion interbody LUMBAR THREE-FOUR;  Surgeon: Melina Schools, MD;  Location: [facility];  Service: Orthopedics;  Laterality: N/A;  4.5 hrs for both procedures   BACK SURGERY     discectomy L3   CYSTOSCOPY WITH RETROGRADE PYELOGRAM, URETEROSCOPY AND STENT PLACEMENT Right 08/29/2014   Procedure: CYSTOSCOPY WITH RETROGRADE PYELOGRAM, URETEROSCOPY, STONE REMOVAL;  Surgeon: Festus Aloe, MD;  Location: [facility]  Service: Urology;  Laterality: Right;   CYSTOSCOPY WITH RETROGRADE PYELOGRAM, URETEROSCOPY AND STENT PLACEMENT Left 09/18/2015   Procedure: CYSTOSCOPY WITH LEFT URETEROSCOPY LEFT RETROGRADE PYELOGRAM ;  Surgeon: Festus Aloe, MD;  Location: [facility];  Service: Urology;  Laterality: Left;   CYSTOSCOPY/URETEROSCOPY/HOLMIUM LASER/STENT PLACEMENT Left 06/15/2022   Procedure: LEFT URETEROSCOPY/HOLMIUM LASER LITHOTRIPSY/STENT PLACEMENT/retrograde pyelogram;  Surgeon: Vira Agar, MD;  Location: [facility];  Service:  Urology;  Laterality: Left;   MOHS SURGERY      Current Medications: No outpatient medications have been marked as taking for the 07/06/22 encounter (Appointment) with Janina Mayo, MD.     Allergies:   Patient has no known allergies.   Social History   Socioeconomic History   Marital status: Married    Spouse name: Not on file   Number of children: Not on file   Years of education: Not on file   Highest education level: Not on file  Occupational History   Not on file  Tobacco Use   Smoking status: Every Day    Packs/day: 1.00    Years: 30.00    Total pack years: 30.00    Types: Cigarettes   Smokeless tobacco: Never   Tobacco comments:    Not currently using nicotine patches  Vaping Use   Vaping Use: Never used  Substance and Sexual Activity   Alcohol use: No   Drug use: No   Sexual activity: Not on file  Other Topics Concern   Not on file  Social History Narrative   Not on file   Social Determinants of Health   Financial Resource Strain: Not on file  Food Insecurity: Not on file  Transportation Needs: Not on file  Physical Activity: Not on file  Stress: Not on file  Social Connections: Not on file     Family History: The patient's ***family history is not on file.  ROS:   Please see the history of present illness.    *** All other systems reviewed and are negative.  EKGs/Labs/Other Studies Reviewed:    The following studies were reviewed today: ***  EKG:  EKG is *** ordered today.  The ekg ordered today demonstrates ***  Recent Labs: 05/28/2022: ALT 25; Platelets 357 06/15/2022: BUN 14; Creatinine, Ser 0.80; Hemoglobin 16.0; Potassium 4.2; Sodium 137  Recent Lipid Panel No results found for: "CHOL", "TRIG", "HDL", "CHOLHDL", "VLDL", "LDLCALC", "LDLDIRECT"   Risk Assessment/Calculations:   {Does this patient have ATRIAL FIBRILLATION?:[phone]}       Physical Exam:    VS:  There were no vitals taken for this visit.    Wt Readings from Last  3 Encounters:  06/15/22 268 lb 12.8 oz (121.9 kg)  05/28/22 260 lb (117.9 kg)  06/27/19 225 lb (102.1 kg)     GEN: *** Well nourished, well developed in no acute distress HEENT: Normal NECK: No JVD; No carotid bruits LYMPHATICS: No lymphadenopathy CARDIAC: ***RRR, no murmurs, rubs, gallops RESPIRATORY:  Clear to auscultation without rales, wheezing or rhonchi  ABDOMEN: Soft, non-tender, non-distended MUSCULOSKELETAL:  No edema; No deformity  SKIN: Warm and dry NEUROLOGIC:  Alert and oriented x 3 PSYCHIATRIC:  Normal affect   ASSESSMENT:    Arrhythmia: baseline poor on that EKG in July. Will get 30 day monitor to assess for afib. If he has a diagnosis, chads2vasc is low = 1 for HTN. There's no strong indication for [facility]. PLAN:    In order of problems listed above:  30 day preventiss TTE Follow up 6 months      {Are you ordering a CV Procedure (e.g. stress test, cath, DCCV, TEE, etc)?   Press F2        :[id number]}    Medication Adjustments/Labs and Tests Ordered: Current medicines are reviewed at length with the patient today.  Concerns regarding medicines are outlined above.  No orders of the defined types were placed in this encounter.  No orders of the defined types were placed in this encounter.   There are no Patient Instructions on file for this visit.   Signed, Janina Mayo, MD  07/04/2022 3:46 PM    [place]

## 2022-07-06 ENCOUNTER — Ambulatory Visit: Payer: Medicare HMO | Admitting: Internal Medicine

## 2022-07-19 ENCOUNTER — Encounter: Payer: Self-pay | Admitting: Internal Medicine

## 2022-07-26 ENCOUNTER — Encounter: Payer: Self-pay | Admitting: Internal Medicine

## 2024-03-15 ENCOUNTER — Encounter (HOSPITAL_BASED_OUTPATIENT_CLINIC_OR_DEPARTMENT_OTHER): Payer: Self-pay | Admitting: Emergency Medicine

## 2024-03-15 ENCOUNTER — Other Ambulatory Visit: Payer: Self-pay

## 2024-03-15 ENCOUNTER — Emergency Department (HOSPITAL_BASED_OUTPATIENT_CLINIC_OR_DEPARTMENT_OTHER)
Admission: EM | Admit: 2024-03-15 | Discharge: 2024-03-16 | Disposition: A | Discharge status: Home / Self Care | Attending: Emergency Medicine | Admitting: Emergency Medicine

## 2024-03-15 DIAGNOSIS — Z79899 Other long term (current) drug therapy: Secondary | ICD-10-CM | POA: Insufficient documentation

## 2024-03-15 DIAGNOSIS — R109 Unspecified abdominal pain: Secondary | ICD-10-CM | POA: Diagnosis present

## 2024-03-15 DIAGNOSIS — N200 Calculus of kidney: Secondary | ICD-10-CM

## 2024-03-15 DIAGNOSIS — N132 Hydronephrosis with renal and ureteral calculous obstruction: Secondary | ICD-10-CM | POA: Diagnosis not present

## 2024-03-15 DIAGNOSIS — I10 Essential (primary) hypertension: Secondary | ICD-10-CM | POA: Diagnosis not present

## 2024-03-15 LAB — URINALYSIS, MICROSCOPIC (REFLEX): RBC / HPF: >50 RBC/hpf (ref 0–5)

## 2024-03-15 LAB — URINALYSIS, ROUTINE W REFLEX MICROSCOPIC
Bilirubin Urine: NEGATIVE
Glucose, UA: NEGATIVE mg/dL
Ketones, ur: NEGATIVE mg/dL
Leukocytes,Ua: NEGATIVE
Nitrite: NEGATIVE
Protein, ur: 30 mg/dL — AB
Specific Gravity, Urine: >=1.03 (ref 1.005–1.030)
pH: 6 (ref 5.0–8.0)

## 2024-03-15 LAB — CBC
HCT: 44.1 % (ref 39.0–52.0)
Hemoglobin: 15.6 g/dL (ref 13.0–17.0)
MCH: 30.6 pg (ref 26.0–34.0)
MCHC: 35.4 g/dL (ref 30.0–36.0)
MCV: 86.6 fL (ref 80.0–100.0)
Platelets: 409 10*3/uL — ABNORMAL HIGH (ref 150–400)
RBC: 5.09 MIL/uL (ref 4.22–5.81)
RDW: 13.1 % (ref 11.5–15.5)
WBC: 11.1 10*3/uL — ABNORMAL HIGH (ref 4.0–10.5)
nRBC: 0 % (ref 0.0–0.2)

## 2024-03-15 LAB — BASIC METABOLIC PANEL WITH GFR
Anion gap: 8 (ref 5–15)
BUN: 17 mg/dL (ref 6–20)
CO2: 21 mmol/L — ABNORMAL LOW (ref 22–32)
Calcium: 9.2 mg/dL (ref 8.9–10.3)
Chloride: 108 mmol/L (ref 98–111)
Creatinine, Ser: 1.12 mg/dL (ref 0.61–1.24)
GFR, Estimated: >60 mL/min (ref >60)
Glucose, Bld: 165 mg/dL — ABNORMAL HIGH (ref 70–99)
Potassium: 4.2 mmol/L (ref 3.5–5.1)
Sodium: 137 mmol/L (ref 135–145)

## 2024-03-15 MED ORDER — KETOROLAC TROMETHAMINE 30 MG/ML IJ SOLN
30.0000 mg | Freq: Once | INTRAMUSCULAR | Status: AC
  Administered 2024-03-15: 30 mg via INTRAVENOUS
  Filled 2024-03-15: qty 1

## 2024-03-15 MED ORDER — ONDANSETRON HCL 4 MG/2ML IJ SOLN
4.0000 mg | Freq: Once | INTRAMUSCULAR | Status: AC
  Administered 2024-03-15: 4 mg via INTRAVENOUS
  Filled 2024-03-15: qty 2

## 2024-03-15 MED ORDER — HYDROMORPHONE HCL 1 MG/ML IJ SOLN
1.0000 mg | Freq: Once | INTRAMUSCULAR | Status: AC
  Administered 2024-03-15: 1 mg via INTRAVENOUS
  Filled 2024-03-15: qty 1

## 2024-03-15 NOTE — ED Triage Notes (Signed)
 Right sided flank pain associated with hematuria onset 10 am this morning. Reports h/o kidney stones. States feels the same.

## 2024-03-15 NOTE — ED Provider Notes (Signed)
 [place] [facility] Provider Note   CSN: [id number] Arrival date & time: 03/15/24  2210     History  Chief Complaint  Patient presents with   Flank Pain    Tristan Wood is a 58 y.o. male.  Patient is a 58 year old male with past medical history of hypertension and kidney stones.  Patient presenting today with complaints of right flank pain.  This started this morning and is worsening as the day goes on.  This feels similar to prior kidney stones.  He does report seeing some blood in his urine.  No fevers or chills.  No aggravating or alleviating factors.       Home Medications Prior to Admission medications   Medication Sig Start Date End Date Taking? Authorizing Provider  amLODipine (NORVASC) 5 MG tablet Take 5 mg by mouth daily.    [provider]  ergocalciferol (VITAMIN D2) 1.25 MG (50000 UT) capsule Take 50,000 Units by mouth once a week.    [provider]  omeprazole (PRILOSEC) 20 MG capsule Take 20 mg by mouth daily.    [provider]  ondansetron  (ZOFRAN -ODT) 4 MG disintegrating tablet Take 1 tablet (4 mg total) by mouth every 8 (eight) hours as needed for nausea or vomiting. 05/28/22   Scarlette Currier, MD  oxyCODONE -acetaminophen  (PERCOCET) 10-325 MG tablet Take 1 tablet by mouth every 6 (six) hours as needed for pain. 06/15/22   Mallie Seal, MD  pregabalin (LYRICA) 50 MG capsule Take 50 mg by mouth 3 (three) times daily.    [provider]  tamsulosin  (FLOMAX ) 0.4 MG CAPS capsule Take 1 capsule (0.4 mg total) by mouth daily after supper. 06/15/22   Mallie Seal, MD      Allergies    Patient has no known allergies.    Review of Systems   Review of Systems  All other systems reviewed and are negative.   Physical Exam Updated Vital Signs BP 136/81   Pulse 72   Temp 97.8 F (36.6 C)   Resp 20   Ht 6' (1.829 m)   Wt 117.9 kg   SpO2 96%   BMI 35.26 kg/m  Physical  Exam Vitals and nursing note reviewed.  Constitutional:      General: He is not in acute distress.    Appearance: He is well-developed. He is not diaphoretic.  HENT:     Head: Normocephalic and atraumatic.  Cardiovascular:     Rate and Rhythm: Normal rate and regular rhythm.     Heart sounds: No murmur heard.    No friction rub.  Pulmonary:     Effort: Pulmonary effort is normal. No respiratory distress.     Breath sounds: Normal breath sounds. No wheezing or rales.  Abdominal:     General: Bowel sounds are normal. There is no distension.     Palpations: Abdomen is soft.     Tenderness: There is no abdominal tenderness. There is right CVA tenderness. There is no left CVA tenderness, guarding or rebound.  Musculoskeletal:        General: Normal range of motion.     Cervical back: Normal range of motion and neck supple.  Skin:    General: Skin is warm and dry.  Neurological:     Mental Status: He is alert and oriented to person, place, and time.     Coordination: Coordination normal.     ED Results / Procedures / Treatments   Labs (  all labs ordered are listed, but only abnormal results are displayed) Labs Reviewed  URINALYSIS, ROUTINE W REFLEX MICROSCOPIC - Abnormal; Notable for the following components:      Result Value   APPearance CLOUDY (*)    Hgb urine dipstick LARGE (*)    Protein, ur 30 (*)    All other components within normal limits  BASIC METABOLIC PANEL WITH GFR - Abnormal; Notable for the following components:   CO2 21 (*)    Glucose, Bld 165 (*)    All other components within normal limits  CBC - Abnormal; Notable for the following components:   WBC 11.1 (*)    Platelets 409 (*)    All other components within normal limits  URINALYSIS, MICROSCOPIC (REFLEX) - Abnormal; Notable for the following components:   Bacteria, UA RARE (*)    All other components within normal limits    EKG None  Radiology No results found.  Procedures Procedures   {Document cardiac monitor, telemetry assessment procedure when appropriate:1}  Medications Ordered in ED Medications  HYDROmorphone  (DILAUDID ) injection 1 mg (has no administration in time range)  ondansetron  (ZOFRAN ) injection 4 mg (has no administration in time range)  ketorolac  (TORADOL ) 30 MG/ML injection 30 mg (has no administration in time range)    ED Course/ Medical Decision Making/ A&P   {   Click here for ABCD2, HEART and other calculatorsREFRESH Note before signing :1}                              Medical Decision Making Amount and/or Complexity of Data Reviewed Labs: ordered. Radiology: ordered.  Risk Prescription drug management.   ***  {Document critical care time when appropriate:1} {Document review of labs and clinical decision tools ie heart score, Chads2Vasc2 etc:1}  {Document your independent review of radiology images, and any outside records:1} {Document your discussion with family members, caretakers, and with consultants:1} {Document social determinants of health affecting pt's care:1} {Document your decision making why or why not admission, treatments were needed:1} Final Clinical Impression(s) / ED Diagnoses Final diagnoses:  None    Rx / DC Orders ED Discharge Orders     None

## 2024-03-16 ENCOUNTER — Emergency Department (HOSPITAL_BASED_OUTPATIENT_CLINIC_OR_DEPARTMENT_OTHER)

## 2024-03-16 MED ORDER — OXYCODONE-ACETAMINOPHEN 5-325 MG PO TABS: 1.0000 | ORAL_TABLET | Freq: Four times a day (QID) | ORAL | 0 refills | Status: AC | PRN

## 2024-03-16 MED ORDER — HYDROMORPHONE HCL 1 MG/ML IJ SOLN
1.0000 mg | Freq: Once | INTRAMUSCULAR | Status: AC
  Administered 2024-03-16: 1 mg via INTRAVENOUS
  Filled 2024-03-16: qty 1

## 2024-03-16 MED ORDER — TAMSULOSIN HCL 0.4 MG PO CAPS: 0.4000 mg | ORAL_CAPSULE | Freq: Every day | ORAL | 0 refills | Status: AC

## 2024-03-16 NOTE — Discharge Instructions (Signed)
 Begin taking Percocet as prescribed as needed for pain.  Begin taking Flomax  as prescribed.  Follow-up with urology on Monday if symptoms are not improving.  The contact information for [facility] has been provided in this discharge summary for you to call and make these arrangements.
